# Patient Record
Sex: Female | Born: 1965 | Race: White | Hispanic: No | Marital: Married | Smoking: Never smoker
Health system: Southern US, Community
[De-identification: ages and names within clinical notes are randomized; demographics above are authoritative.]

## PROBLEM LIST (undated history)

## (undated) ENCOUNTER — Ambulatory Visit (HOSPITAL_COMMUNITY): Admission: EM | Payer: BC Managed Care – PPO | Source: Home / Self Care

## (undated) DIAGNOSIS — I10 Essential (primary) hypertension: Secondary | ICD-10-CM

## (undated) DIAGNOSIS — D649 Anemia, unspecified: Secondary | ICD-10-CM

## (undated) DIAGNOSIS — E063 Autoimmune thyroiditis: Secondary | ICD-10-CM

## (undated) DIAGNOSIS — R519 Headache, unspecified: Secondary | ICD-10-CM

## (undated) DIAGNOSIS — F329 Major depressive disorder, single episode, unspecified: Secondary | ICD-10-CM

## (undated) DIAGNOSIS — R0600 Dyspnea, unspecified: Secondary | ICD-10-CM

## (undated) DIAGNOSIS — IMO0002 Reserved for concepts with insufficient information to code with codable children: Secondary | ICD-10-CM

## (undated) DIAGNOSIS — F32A Depression, unspecified: Secondary | ICD-10-CM

## (undated) DIAGNOSIS — K219 Gastro-esophageal reflux disease without esophagitis: Secondary | ICD-10-CM

## (undated) DIAGNOSIS — Z789 Other specified health status: Secondary | ICD-10-CM

## (undated) DIAGNOSIS — D250 Submucous leiomyoma of uterus: Secondary | ICD-10-CM

## (undated) DIAGNOSIS — Z973 Presence of spectacles and contact lenses: Secondary | ICD-10-CM

## (undated) DIAGNOSIS — N950 Postmenopausal bleeding: Secondary | ICD-10-CM

## (undated) HISTORY — DX: Anemia, unspecified: D64.9

## (undated) HISTORY — DX: Major depressive disorder, single episode, unspecified: F32.9

## (undated) HISTORY — PX: OTHER SURGICAL HISTORY: SHX169

## (undated) HISTORY — DX: Depression, unspecified: F32.A

## (undated) HISTORY — PX: TOOTH EXTRACTION: SHX859

## (undated) HISTORY — DX: Essential (primary) hypertension: I10

## (undated) HISTORY — DX: Reserved for concepts with insufficient information to code with codable children: IMO0002

## (undated) HISTORY — PX: TONSILLECTOMY AND ADENOIDECTOMY: SHX28

---

## 2002-09-19 ENCOUNTER — Other Ambulatory Visit: Admission: RE | Admit: 2002-09-19 | Discharge: 2002-09-19 | Payer: Self-pay | Admitting: Obstetrics & Gynecology

## 2003-09-30 ENCOUNTER — Other Ambulatory Visit: Admission: RE | Admit: 2003-09-30 | Discharge: 2003-09-30 | Payer: Self-pay | Admitting: Obstetrics & Gynecology

## 2009-06-15 ENCOUNTER — Encounter: Admission: RE | Admit: 2009-06-15 | Discharge: 2009-06-15 | Payer: Self-pay | Admitting: Family Medicine

## 2011-01-28 ENCOUNTER — Ambulatory Visit
Admission: RE | Admit: 2011-01-28 | Discharge: 2011-01-28 | Disposition: A | Discharge status: Home / Self Care | Payer: BC Managed Care – PPO | Source: Ambulatory Visit | Attending: Dermatology | Admitting: Dermatology

## 2011-01-28 ENCOUNTER — Other Ambulatory Visit: Payer: Self-pay | Admitting: Dermatology

## 2011-01-28 DIAGNOSIS — M25569 Pain in unspecified knee: Secondary | ICD-10-CM

## 2013-05-24 ENCOUNTER — Encounter: Payer: Self-pay | Admitting: Obstetrics and Gynecology

## 2013-05-24 ENCOUNTER — Ambulatory Visit (INDEPENDENT_AMBULATORY_CARE_PROVIDER_SITE_OTHER): Payer: BC Managed Care – PPO | Admitting: Obstetrics and Gynecology

## 2013-05-24 VITALS — BP 110/70 | HR 70 | Ht 64.5 in | Wt 174.5 lb

## 2013-05-24 DIAGNOSIS — R3129 Other microscopic hematuria: Secondary | ICD-10-CM

## 2013-05-24 DIAGNOSIS — Z01419 Encounter for gynecological examination (general) (routine) without abnormal findings: Secondary | ICD-10-CM

## 2013-05-24 DIAGNOSIS — Z0000 Encounter for general adult medical examination without abnormal findings: Secondary | ICD-10-CM

## 2013-05-24 DIAGNOSIS — N951 Menopausal and female climacteric states: Secondary | ICD-10-CM

## 2013-05-24 LAB — POCT URINALYSIS DIPSTICK
Bilirubin, UA: NEGATIVE
Nitrite, UA: NEGATIVE
Protein, UA: NEGATIVE
pH, UA: 5

## 2013-05-24 LAB — THYROID PANEL WITH TSH
Free Thyroxine Index: 3.3 (ref 1.0–3.9)
T4, Total: 9.9 ug/dL (ref 5.0–12.5)
TSH: 5.021 u[IU]/mL — ABNORMAL HIGH (ref 0.350–4.500)

## 2013-05-24 MED ORDER — NYSTATIN-TRIAMCINOLONE 100000-0.1 UNIT/GM-% EX OINT: TOPICAL_OINTMENT | Freq: Two times a day (BID) | CUTANEOUS | Status: DC

## 2013-05-24 NOTE — Patient Instructions (Signed)

## 2013-05-24 NOTE — Progress Notes (Signed)
Patient ID: Jaclyn Murray, female   DOB: 12-01-1965, 47 y.o.   MRN: [id number] GYNECOLOGY VISIT  PCP:  Holley Bouche, MD  Referring provider:   HPI: 47 y.o.   Married  Caucasian  female   G0P0 with Patient's last menstrual period was 01/06/2013.   here for   AEX. Irregular menses for 5 years.  (In past were always regular and heavy.) Never goes more than three months until now. When periods occur, they last 3 - 4 days, has 1 - 2 days of heavy flow. Can be light. No intermenstrual bleeding.  No significant cramping.   Hot flashes and night sweats. Taking ProGest progesterone cream.  Has not tried [place] yet.   Foggy brain.  Mood swings.  Has switched to a ketogenic diet and already feeling better. Takes Melatonin.  Open to hormonal therapy.  Prefers a natural option.  Took low dose OCPs 4 years ago and felt better than she fel in year while she was taking them.  Wants a refill of Mycology ointment.  Uses externally as needed.  Hgb:  PCP Urine:  Trace RBC's (No symptoms today.  No history of hematuria.)  GYNECOLOGIC HISTORY: Patient's last menstrual period was 01/06/2013. Sexually active:  yes Partner preference: female Contraception: vasectomy   Menopausal hormone therapy: n/a DES exposure:   no Blood transfusions:  no  Sexually transmitted diseases: no   GYN Procedures:  no Mammogram:    never             Pap:  09/2011 wnl  History of abnormal pap smear:  20+ years ago had colposcopy but no treatment to cervix.  Paps reverted to normal.   OB History   Grav Para Term Preterm Abortions TAB SAB Ect Mult Living   0                LIFESTYLE: Exercise:      Walking and body weight exercises         Tobacco:      no Alcohol:        1 drink per month Drug use:     no  OTHER HEALTH MAINTENANCE: Tetanus/TDap:  2010 Gardisil:  NA Influenza:   04/2012 Zostavax:  NA  Bone density:  n/a Colonoscopy:  n/a  Cholesterol check:  04/2012 wnl  Family History  Problem  Relation Age of Onset  . Dementia Mother   . Hypertension Mother   . Parkinson's disease Father   . Cancer Maternal Grandmother 71    colon ca  . Stroke Maternal Grandmother   . Thyroid disease Maternal Grandmother   . Cancer Paternal Grandfather 43    colon ca  . Diabetes Sister   . Hypertension Sister   . Thyroid disease Sister   . Diabetes Sister   . Hypertension Sister   . Hypertension Maternal Grandfather     There are no active problems to display for this patient.  Past Medical History  Diagnosis Date  . Anemia   . Depression   . Dyspareunia     "dryness"  . Hypertension     Past Surgical History  Procedure Laterality Date  . Tonsillectomy and adenoidectomy      age 4    ALLERGIES: Ace inhibitors; Ciprofloxacin; and Erythromycin  Current Outpatient Prescriptions  Medication Sig Dispense Refill  . Cholecalciferol (VITAMIN D) 2000 UNITS tablet Take 2,000 Units by mouth 2 (two) times daily.      . Cyanocobalamin (VITAMIN B-12 CR PO) Take  1 tablet by mouth daily.      . fish oil-omega-3 fatty acids 1000 MG capsule Take 2 g by mouth daily.      . hydrochlorothiazide (MICROZIDE) 12.5 MG capsule Take 12.5 mg by mouth daily.      Marland Kitchen losartan (COZAAR) 100 MG tablet Take 100 mg by mouth daily.      . naproxen sodium (ANAPROX) 550 MG tablet Take 500 mg by mouth daily.      . potassium chloride SA (K-DUR,KLOR-CON) 20 MEQ tablet Take 20 mEq by mouth daily.      . S-Adenosylmethionine (SAM-E COMPLETE PO) Take 1 tablet by mouth daily.       No current facility-administered medications for this visit.     ROS:  Pertinent items are noted in HPI.  SOCIAL HISTORY:  Runner, broadcasting/film/video.   PHYSICAL EXAMINATION:    BP 110/70  Pulse 70  Ht 5' 4.5" (1.638 m)  Wt 174 lb 8 oz (79.153 kg)  BMI 29.5 kg/m2  LMP 01/06/2013   Wt Readings from Last 3 Encounters:  05/24/13 174 lb 8 oz (79.153 kg)     Ht Readings from Last 3 Encounters:  05/24/13 5' 4.5" (1.638 m)    General appearance:  alert, cooperative and appears stated age Head: Normocephalic, without obvious abnormality, atraumatic Neck: no adenopathy, supple, symmetrical, trachea midline and thyroid not enlarged, symmetric, no tenderness/mass/nodules Lungs: clear to auscultation bilaterally Breasts: Inspection negative, No nipple retraction or dimpling, No nipple discharge or bleeding, No axillary or supraclavicular adenopathy, Normal to palpation without dominant masses Heart: regular rate and rhythm Abdomen: soft, non-tender; no masses,  no organomegaly Extremities: extremities normal, atraumatic, no cyanosis or edema Skin: Skin color, texture, turgor normal. No rashes or lesions Lymph nodes: Cervical, supraclavicular, and axillary nodes normal. No abnormal inguinal nodes palpated Neurologic: Grossly normal  Pelvic: External genitalia:  no lesions              Urethra:  normal appearing urethra with no masses, tenderness or lesions              Bartholins and Skenes: normal                 Vagina: normal appearing vagina with normal color and discharge, no lesions              Cervix: normal appearance              Pap and high risk HPV testing done: yes.            Bimanual Exam:  Uterus:  uterus is normal size, shape, consistency and nontender                                      Adnexa: normal adnexa in size, nontender and no masses                                      Rectovaginal: Confirms                                      Anus:  normal sphincter tone, no lesions  ASSESSMENT  Normal gynecologic exam. Menopausal symptoms. Skipped cycles. Microscopic hematuria.  PLAN  Mammogram at [facility].  Patient  will call to schedule.  Pap smear and high risk HPV testing Counseled on use and side effects of HRT.  Will prescribe low dose OCPs or Vivelle Dot and prometrium after normal mammogram returns and blood work back.  Patient will call as a reminder to prescribe. Thyroid panel, FSH, estradiol Will  send urine micro as well.  Recheck in 3 months. Return annually or prn   An After Visit Summary was printed and given to the patient.

## 2013-05-24 NOTE — Addendum Note (Signed)
Addended by: Alphonsa Overall on: 05/24/2013 08:44 AM   Modules accepted: Orders

## 2013-05-25 ENCOUNTER — Other Ambulatory Visit: Payer: Self-pay | Admitting: Obstetrics and Gynecology

## 2013-05-25 DIAGNOSIS — R7989 Other specified abnormal findings of blood chemistry: Secondary | ICD-10-CM

## 2013-05-25 LAB — ESTRADIOL: Estradiol: 23.9 pg/mL

## 2013-05-27 ENCOUNTER — Telehealth: Payer: Self-pay

## 2013-05-27 NOTE — Telephone Encounter (Signed)
Message copied by Alphonsa Overall on Mon May 27, 2013 12:36 PM ------      Message from: Conley Simmonds      Created: Sat May 25, 2013  4:59 AM       Please report labs to patient:            Urine showed no further hematuria with microscopic exam.            TSH is elevated and will need a repeat blood test here when she comes in for a 3 month recheck.  The remaining thyroid parameters are normal (thyroid hormone).              FSH and estradiol look like menopause.            After normal mammogram has returned, patient will call for a prescription for Vivelle Dot and prometrium ------

## 2013-05-27 NOTE — Telephone Encounter (Signed)
Patient notified of lab results and she will call back after her mammogram for RX for Vivelle dot and Prometrium.

## 2013-05-27 NOTE — Telephone Encounter (Signed)
LMOVM to call for lab results. 

## 2013-05-27 NOTE — Telephone Encounter (Signed)
Patient is returning Amanda's call

## 2013-06-03 ENCOUNTER — Ambulatory Visit (HOSPITAL_COMMUNITY)
Admission: RE | Admit: 2013-06-03 | Discharge: 2013-06-03 | Disposition: A | Discharge status: Home / Self Care | Payer: BC Managed Care – PPO | Source: Ambulatory Visit | Attending: Obstetrics and Gynecology | Admitting: Obstetrics and Gynecology

## 2013-06-03 DIAGNOSIS — Z01419 Encounter for gynecological examination (general) (routine) without abnormal findings: Secondary | ICD-10-CM

## 2013-06-03 DIAGNOSIS — Z1231 Encounter for screening mammogram for malignant neoplasm of breast: Secondary | ICD-10-CM | POA: Insufficient documentation

## 2013-06-10 ENCOUNTER — Telehealth: Payer: Self-pay | Admitting: Obstetrics and Gynecology

## 2013-06-10 ENCOUNTER — Other Ambulatory Visit: Payer: Self-pay | Admitting: Obstetrics and Gynecology

## 2013-06-10 DIAGNOSIS — R7989 Other specified abnormal findings of blood chemistry: Secondary | ICD-10-CM

## 2013-06-10 MED ORDER — PROGESTERONE MICRONIZED 100 MG PO CAPS: 100.0000 mg | ORAL_CAPSULE | Freq: Every day | ORAL | Status: DC

## 2013-06-10 MED ORDER — ESTRADIOL 0.05 MG/24HR TD PTTW: 1.0000 | MEDICATED_PATCH | TRANSDERMAL | Status: DC

## 2013-06-10 NOTE — Telephone Encounter (Signed)
Please inform the patient I sent an RX to her pharmacy for Vivelle Dot and Prometrium to her pharmacy.  Patient will need a recheck in 3 months for both her hormone medication and for thyroid function retesting. Her TSH was elevated but she had remaining thyroid testing normal.   I will ask her to get Ketosticks from her PCP.  I do not have enough familiarity with them to be able to prescribe them.

## 2013-06-10 NOTE — Telephone Encounter (Signed)
Patient had MMG on 06-03-13, Birads1, result in computer. FSH labs from 05-24-13 showed menopause and result note indicates you planned Vivelle Dot and Prometrium. Patient is ready to begin this. States that in the meantime she has started a "ketogenic diet" which maintains ketosis and is helping with her hot flashes.  WT 170, has lost 7 lbs, not intended for weight loss but menopausal symptoms management. She would like to know if you will prescribe the ketostix for blood ketosis monitoring, like the same stix for blood glucose monitoring, so it will help with insurance coverage. Advised will review with provider and call her back with instructions on Vivelle Dot and Prometrium.  [employer].

## 2013-06-10 NOTE — Telephone Encounter (Signed)
Spoke with pt about BS advice and Rx's sent to pharmacy. Pt already scheduled for 3 month checkup in January. Pt will ask PCP about ketosticks.

## 2013-06-10 NOTE — Telephone Encounter (Signed)
Chief Complaint  Patient presents with   Results  pt says she has gotten her mammogram results back so she could start hrt.

## 2013-06-13 ENCOUNTER — Other Ambulatory Visit: Payer: Self-pay

## 2013-06-20 ENCOUNTER — Telehealth: Payer: Self-pay | Admitting: Obstetrics and Gynecology

## 2013-06-20 NOTE — Telephone Encounter (Signed)
Phone call to patient.  On HRT for 10 days now, Vivelle and Prometrium.  Started with spotting last night.  Now bleeding and cramping.  Feels like she is on her period. LMP prior was June 2014.   Patient will continue HRT. Keep bleeding calendar. If has prolonged bleeding over one week, will call back.  If has monthly cycles return, will switch to low dose OCP.

## 2013-06-20 NOTE — Telephone Encounter (Signed)
Patient started HRT recently and is calling re: "Started spotting last night and bleeding today?" Please advise?

## 2013-06-20 NOTE — Telephone Encounter (Signed)
Dr. Edward Jolly,  Spoke with patient. Started Vivelle and Prometrium 11/3. Last night started with some spotting and today having some increased bleeding. Using pad and changing q 3 hours. Noticed some clotting. LMP 01/06/2013. Has not missed any progesterone tablets that patient is aware of.  What do you advise?

## 2013-07-25 ENCOUNTER — Telehealth: Payer: Self-pay | Admitting: Obstetrics and Gynecology

## 2013-07-25 NOTE — Telephone Encounter (Signed)
Patient says she has been bleeding while in HRT.

## 2013-07-25 NOTE — Telephone Encounter (Signed)
Spoke with patient. States that she has had 3 cycles since starting on vivelle and prometrium. Keeping calendar as Dr. Edward Jolly has suggested. Per Dr. Rica Records note, may switch to a low dose ocp if still continues with bleeding (note from 06/20/13). Patient declines OV for Tuesday (next available with Dr. Edward Jolly) due to going out town for holidays.  Advised would send message to covering provider and Dr. Edward Jolly for disposition.   Dr. Hyacinth Meeker can you review and advise if can wait for Dr. Rica Records return?

## 2013-07-25 NOTE — Telephone Encounter (Signed)
Spoke with patient and message from Dr. Hyacinth Meeker given. Verbalized understanding of instructions and agreeable to plan of care.  Will continue with cycle calendar and call back to update.

## 2013-07-25 NOTE — Telephone Encounter (Signed)
Change prometrium to 200mg  days 1-15 monthly.  Stop Prometrium today.  Start 08/08/13 with new dosage.  Expect monthly cycling for a while.

## 2013-08-30 ENCOUNTER — Ambulatory Visit (INDEPENDENT_AMBULATORY_CARE_PROVIDER_SITE_OTHER): Payer: BC Managed Care – PPO | Admitting: Obstetrics and Gynecology

## 2013-08-30 ENCOUNTER — Encounter: Payer: Self-pay | Admitting: Obstetrics and Gynecology

## 2013-08-30 VITALS — BP 119/81 | HR 71 | Resp 16 | Wt 181.0 lb

## 2013-08-30 DIAGNOSIS — R7989 Other specified abnormal findings of blood chemistry: Secondary | ICD-10-CM

## 2013-08-30 DIAGNOSIS — R946 Abnormal results of thyroid function studies: Secondary | ICD-10-CM

## 2013-08-30 DIAGNOSIS — N951 Menopausal and female climacteric states: Secondary | ICD-10-CM

## 2013-08-30 DIAGNOSIS — N952 Postmenopausal atrophic vaginitis: Secondary | ICD-10-CM

## 2013-08-30 LAB — THYROID PANEL WITH TSH
FREE THYROXINE INDEX: 3.1 (ref 1.0–3.9)
T3 Uptake: 34.1 % (ref 22.5–37.0)
T4, Total: 9.1 ug/dL (ref 5.0–12.5)
TSH: 6.204 u[IU]/mL — ABNORMAL HIGH (ref 0.350–4.500)

## 2013-08-30 MED ORDER — ESTRADIOL 0.05 MG/24HR TD PTTW: 1.0000 | MEDICATED_PATCH | TRANSDERMAL | Status: DC

## 2013-08-30 MED ORDER — ESTROGENS, CONJUGATED 0.625 MG/GM VA CREA: 1.0000 | TOPICAL_CREAM | Freq: Every day | VAGINAL | Status: DC

## 2013-08-30 MED ORDER — PROGESTERONE MICRONIZED 200 MG PO CAPS: ORAL_CAPSULE | ORAL | Status: DC

## 2013-08-30 NOTE — Progress Notes (Signed)
Patient ID: Jaclyn Murray, female   DOB: 24-May-1966, 48 y.o.   MRN: [id number]  Subjective  48 year old female here for a recheck of HRT. Seen for menopausal symptoms in the fall and had labs 05/24/13 showing FHS 77.2, Estradiol 23.9, and TSH 5.021 with T4 9.0. Started daily Prometrium 100 mg and Vivelle 0.05 mg. Patient called in with bleeding and was switched to Prometrium 200 mg days 1 - 15.  No bleeding since taking Prometrium 100 mg days 1 - 15.  Thought she was supposed to take only one capsule. Is not on 200 mg.  Changing Vivelle 0.05 twice a week.Loes not having hot flashes or night sweats other than a couple of times. Fogginess of brain may be a little improved. Mood is much more stable.    Some breast tenderness when had a menstrual cycle. No headaches of fluid retention.  Still with vaginal discomfort with dryness.  Mammogram was normal.  Objective  No exam  Assessment  Menopausal symptoms. On Vivelle 0.05 twice weekly and Prometrium 100 mg d 1 - 15 Atrophic vaginitis. Elevated TSH.  Plan  I have increased the Prometrium to 200 mg d 1 - 15.  Patient did not understand the dosage recommended previously when she called in. Continue Vivelle. Refills on both for 11 months.  See Epic.  Keep bleeding calendar. Premarin cream 1/2 gm pv at hs for 2 weeks and then use twice weekly.  See Epic.  Check TFTs today.  Annual exam in October 2015 and sooner as needed.

## 2013-08-30 NOTE — Patient Instructions (Signed)
Conjugated Estrogens vaginal cream What is this medicine? CONJUGATED ESTROGENS (CON ju gate ed ESS troe jenz) are a mixture of female hormones. This cream can help relieve symptoms associated with menopause.like vaginal dryness and irritation. This medicine may be used for other purposes; ask your health care provider or pharmacist if you have questions. COMMON BRAND NAME(S): Premarin What should I tell my health care provider before I take this medicine? They need to know if you have any of these conditions: -abnormal vaginal bleeding -blood vessel disease or blood clots -breast, cervical, endometrial, or uterine cancer -dementia -diabetes -gallbladder disease -heart disease or recent heart attack -high blood pressure -high cholesterol -high level of calcium in the blood -hysterectomy -kidney disease -liver disease -migraine headaches -protein C deficiency -protein S deficiency -stroke -systemic lupus erythematosus (SLE) -tobacco smoker -an unusual or allergic reaction to estrogens other medicines, foods, dyes, or preservatives -pregnant or trying to get pregnant -breast-feeding How should I use this medicine? This medicine is for use in the vagina only. Do not take by mouth. Follow the directions on the prescription label. Use at bedtime unless otherwise directed by your doctor or health care professional. Use the special applicator supplied with the cream. Wash hands before and after use. Fill the applicator with the cream and remove from the tube. Lie on your back, part and bend your knees. Insert the applicator into the vagina and push the plunger to expel the cream into the vagina. Wash the applicator with warm soapy water and rinse well. Use exactly as directed for the complete length of time prescribed. Do not stop using except on the advice of your doctor or health care professional. Talk to your pediatrician regarding the use of this medicine in children. Special care may be  needed. A patient package insert for the product will be given with each prescription and refill. Read this sheet carefully each time. The sheet may change frequently. Overdosage: If you think you have taken too much of this medicine contact a poison control center or emergency room at once. NOTE: This medicine is only for you. Do not share this medicine with others. What if I miss a dose? If you miss a dose, use it as soon as you can. If it is almost time for your next dose, use only that dose. Do not use double or extra doses. What may interact with this medicine? Do not take this medicine with any of the following medications: -aromatase inhibitors like aminoglutethimide, anastrozole, exemestane, letrozole, testolactone This medicine may also interact with the following medications: -barbiturates used for inducing sleep or treating seizures -carbamazepine -grapefruit juice -medicines for fungal infections like itraconazole and ketoconazole -raloxifene or tamoxifen -rifabutin -rifampin -rifapentine -ritonavir -some antibiotics used to treat infections -St. John's Wort -warfarin This list may not describe all possible interactions. Give your health care provider a list of all the medicines, herbs, non-prescription drugs, or dietary supplements you use. Also tell them if you smoke, drink alcohol, or use illegal drugs. Some items may interact with your medicine. What should I watch for while using this medicine? Visit your health care professional for regular checks on your progress. You will need a regular breast and pelvic exam. You should also discuss the need for regular mammograms with your health care professional, and follow his or her guidelines. This medicine can make your body retain fluid, making your fingers, hands, or ankles swell. Your blood pressure can go up. Contact your doctor or health care professional if you  feel you are retaining fluid. If you have any reason to think  you are pregnant; stop taking this medicine at once and contact your doctor or health care professional. Tobacco smoking increases the risk of getting a blood clot or having a stroke, especially if you are more than 49 years old. You are strongly advised not to smoke. If you wear contact lenses and notice visual changes, or if the lenses begin to feel uncomfortable, consult your eye care specialist. If you are going to have elective surgery, you may need to stop taking this medicine beforehand. Consult your health care professional for advice prior to scheduling the surgery. What side effects may I notice from receiving this medicine? Side effects that you should report to your doctor or health care professional as soon as possible: -allergic reactions like skin rash, itching or hives, swelling of the face, lips, or tongue -breast tissue changes or discharge -changes in vision -chest pain -confusion, trouble speaking or understanding -dark urine -general ill feeling or flu-like symptoms -light-colored stools -nausea, vomiting -pain, swelling, warmth in the leg -right upper belly pain -severe headaches -shortness of breath -sudden numbness or weakness of the face, arm or leg -trouble walking, dizziness, loss of balance or coordination -unusual vaginal bleeding -yellowing of the eyes or skin Side effects that usually do not require medical attention (report to your doctor or health care professional if they continue or are bothersome): -hair loss -increased hunger or thirst -increased urination -symptoms of vaginal infection like itching, irritation or unusual discharge -unusually weak or tired This list may not describe all possible side effects. Call your doctor for medical advice about side effects. You may report side effects to FDA at 1-800-FDA-1088. Where should I keep my medicine? Keep out of the reach of children. Store at room temperature between 15 and 30 degrees C (59 and 86  degrees F). Throw away any unused medicine after the expiration date. NOTE: This sheet is a summary. It may not cover all possible information. If you have questions about this medicine, talk to your doctor, pharmacist, or health care provider.  2014, Elsevier/Gold Standard. (2010-10-27 09:20:36)

## 2013-09-11 ENCOUNTER — Telehealth: Payer: Self-pay | Admitting: Obstetrics and Gynecology

## 2013-09-11 ENCOUNTER — Other Ambulatory Visit: Payer: Self-pay | Admitting: Obstetrics and Gynecology

## 2013-09-11 MED ORDER — LEVOTHYROXINE SODIUM 50 MCG PO TABS: 50.0000 ug | ORAL_TABLET | Freq: Every day | ORAL | Status: DC

## 2013-09-11 NOTE — Telephone Encounter (Signed)
Ok to begin Synthroid 50 mcg daily.  Take on an empty stomach in the am, one hour before eating.  I will send one month to the pharmacy.

## 2013-09-11 NOTE — Telephone Encounter (Signed)
Patient calling to follow up on Dr Elza Rafter review of her TSH.  Dr Sabra Heck initially reviewed report but patient preferred to await Dr Elza Rafter review.  Discussed that Dr Quincy Simmonds has reviewed and agrees with recommendation for Synthroid but is also fine to send info to PCP and she can continue to manage this with PCP.  Patient states since she has on-going monitoring of BP with PCP, she would like to have all of this done in one location.  I will fax results to PCP dr Azalia Bilis at [facility], (patient states he is not on EPIC) and then patient can contact him to follow up on this.

## 2013-09-11 NOTE — Telephone Encounter (Signed)
Patient has some questions about recent lab results. She states she is not feeling well and wants to speak with the nurse. She also said someone called her last week about the results and someone was supposed to get back to her. There is no phone note documenting that.

## 2013-09-11 NOTE — Telephone Encounter (Signed)
Patient called back to say she called PCP and cannot get appointment until  end of month. She is feeling so tired that she would like to begin the Synthroid. She will plan to keep the appt with PCP and do follow-up there.  Advised usually recheck levels in approximately 6 weeks.  Will call in one month with one refill.     Routing to provider for final review. Patient agreeable to disposition.   Ok to close encounter?

## 2013-09-12 NOTE — Telephone Encounter (Signed)
Advised patient of Dr Elza Rafter instruction for Synthroid.  Patient states she does her workouts in the AM and cannot do this on an empty stomach.  Instructed that this is the optimal way to take this but if unable to do this, try to plan a consistent time daily and take on empty stomach, such as 1 hour before lunch or dinner.   Rx waas sent to pharm for 30 days and 1 refill.  Ok to close encounter?

## 2013-09-12 NOTE — Telephone Encounter (Signed)
Thanks for your assistance in this patient's care.

## 2013-09-27 ENCOUNTER — Telehealth: Payer: Self-pay | Admitting: Obstetrics and Gynecology

## 2013-09-27 NOTE — Telephone Encounter (Signed)
Spoke with patient. She is taking Prometrium 200 mg Days 1-15. She feels she is having side effects r/t to the Prometrium. States she is having very dry mouth, increased hunger and increased sleepiness despite taking it at night. She feels that she knows dry mouth is r/t to Prometrium as it stopped after day 16 when she stopped taking it. She is taking Prometrium at night and it is still making her very drowsy.  She also states that Prometrium is over 100/month and doesn't want to refill it if she can change rx before she needs to start again on the first.  Advised would send request to Dr. Quincy Simmonds and return call with her instructions.

## 2013-09-27 NOTE — Telephone Encounter (Signed)
Patient is calling about there hormone medication saying that she isnt tolerating it well.

## 2013-09-29 ENCOUNTER — Other Ambulatory Visit: Payer: Self-pay | Admitting: Obstetrics and Gynecology

## 2013-09-29 MED ORDER — MEDROXYPROGESTERONE ACETATE 2.5 MG PO TABS: 2.5000 mg | ORAL_TABLET | Freq: Every day | ORAL | Status: DC

## 2013-09-29 NOTE — Telephone Encounter (Signed)
I recommend switching to Provera 2.5 mg daily instead of the Prometrium. I will place an order for this and cancel the Prometrium. Patient will be due also for a thyroid recheck in April.

## 2013-09-30 ENCOUNTER — Other Ambulatory Visit: Payer: Self-pay | Admitting: Orthopedic Surgery

## 2013-09-30 NOTE — Telephone Encounter (Signed)
Spoke with pt to inform her she can switch to provera 2.5 mg daily and stop taking prometrium. Advised that Rx was sent to her pharmacy. Pt wonders if she will take this on days 1-15 just as she did the prometrium? Advised I would clarify with BS and call her back. Offered lab appt in April for pt to have thyroid recheck, and pt plans to have PCP manage from now on. Pt has appt with him next week.  Dr. Quincy Simmonds, is pt to take provera daily, or only on days 1-15?

## 2013-09-30 NOTE — Telephone Encounter (Signed)
LMTCB for message from Dr. Quincy Simmonds and need to make appt in April.

## 2013-09-30 NOTE — Telephone Encounter (Signed)
Please have patient take the Provera 2.5 mg daily.

## 2013-10-01 NOTE — Telephone Encounter (Signed)
Spoke with patient and advise will take Provera 2.5 mg Daily. Patient agreeable.

## 2013-11-11 ENCOUNTER — Other Ambulatory Visit: Payer: Self-pay | Admitting: Obstetrics and Gynecology

## 2013-11-11 NOTE — Telephone Encounter (Signed)
Left Message To Call Back Re: calling patient to see if PCP is following up on her TSH/Synthroid medication

## 2013-11-12 NOTE — Telephone Encounter (Signed)
S/w patient the pharmacy sent [place] this rx by mistake her PCP is following up with her TSH/Synthroid the pharmacy sent a request to him instead.

## 2013-11-12 NOTE — Telephone Encounter (Signed)
Patient returning Jasmine's call.  

## 2013-11-25 ENCOUNTER — Other Ambulatory Visit: Payer: Self-pay | Admitting: Obstetrics and Gynecology

## 2014-03-07 ENCOUNTER — Other Ambulatory Visit: Payer: Self-pay | Admitting: Obstetrics and Gynecology

## 2014-03-10 ENCOUNTER — Telehealth: Payer: Self-pay | Admitting: Obstetrics and Gynecology

## 2014-03-10 NOTE — Telephone Encounter (Signed)
Spoke with patient. Advised patient that refill that was sent over from pharmacy was for Prometrium 100mg  daily. Advised it was denied because we had switched her to Provera 2.5 mg daily. Patient states that she is no longer taking the Provera because she "did not like it." Patient took the Provera for one month and then states she switched back to the Prometrium 100mg  daily as this is what she was on originally patient states. "I think my body was just getting used to the hormones." Patient is requesting a refill. Advised patient would send a message over to [place] regarding this request and give patient a call back. Patient agreeable.

## 2014-03-10 NOTE — Telephone Encounter (Signed)
Patient calling to speak with nurse about her RX for "progesterone microzide." She states we denied the refill.  [employer]

## 2014-03-11 ENCOUNTER — Other Ambulatory Visit: Payer: Self-pay | Admitting: Obstetrics and Gynecology

## 2014-03-11 MED ORDER — PROGESTERONE MICRONIZED 100 MG PO CAPS: 100.0000 mg | ORAL_CAPSULE | Freq: Every day | ORAL | Status: DC

## 2014-03-11 NOTE — Telephone Encounter (Signed)
Rx for Prometrium 100 mg sent to patient's pharmacy.

## 2014-03-12 NOTE — Telephone Encounter (Signed)
Spoke with patient. Advised rx sent to pharmacy on file. Patient agreeable and verbalizes understanding.  Routing to provider for final review. Patient agreeable to disposition. Will close encounter

## 2014-05-31 ENCOUNTER — Other Ambulatory Visit: Payer: Self-pay | Admitting: Obstetrics and Gynecology

## 2014-06-02 NOTE — Telephone Encounter (Signed)
Incoming Refill Request from [facility]: Progesterone 100mg   Last AEX:05/24/13 Last Refill:03/11/14 #30 X 2 Next AEX:NS Last MMG:06/05/13 Bi-Rads Neg  Please Advise

## 2014-06-03 ENCOUNTER — Telehealth: Payer: Self-pay | Admitting: Emergency Medicine

## 2014-06-03 NOTE — Telephone Encounter (Signed)
Message copied by Michele Mcalpine on Tue Jun 03, 2014 11:15 AM ------      Message from: Pence, BROOK E      Created: Mon Jun 02, 2014 11:49 AM      Regarding: Please contact to schedule annual exam       Please contact patient to schedule an annual exam.       I just refilled her Prometrium for one month.             Thanks,             [place] ------

## 2014-06-03 NOTE — Telephone Encounter (Signed)
Dr. Quincy Simmonds,  Spoke with patient. She is agreeable to scheduling annual exam, next open annual exam with you is 09/2014. I have scheduled patient for annual on 11/13 but this is not an annual time slot. Okay to keep as is or reschedule to February? Patient will need further refills if changing appointment.  Patient was also reminded to schedule mammogram.

## 2014-06-03 NOTE — Telephone Encounter (Signed)
Denyse Dago, CMA at 06/02/2014 9:47 AM     Status: Signed        Incoming Refill Request from [facility]: Progesterone 100mg   Last AEX:05/24/13  Last Refill:03/11/14 #30 X 2  Next AEX:NS  Last MMG:06/05/13 Bi-Rads Neg  Please Advise

## 2014-06-03 NOTE — Telephone Encounter (Signed)
I will be happy to see the patient in November.

## 2014-06-09 ENCOUNTER — Other Ambulatory Visit: Payer: Self-pay | Admitting: Obstetrics and Gynecology

## 2014-06-11 ENCOUNTER — Telehealth: Payer: Self-pay

## 2014-06-11 NOTE — Telephone Encounter (Signed)
-----   Message from [place], MD sent at 05/31/2014  3:43 PM EDT ----- Regarding: Patient overdue for thyroid recheck and annual exam Please contact patient to have her return for both her annual exam and thyroid function recheck.   She is overdue for both.  Thanks!  Josefa Half ----- Message -----    From: SYSTEM    Sent: 05/30/2014  12:01 AM      To: Brook E Amundson de Berton Lan, MD

## 2014-06-11 NOTE — Telephone Encounter (Signed)
Patient is scheduled for aex on 11/13 at 10:15am with Dr.Silva.  Routing to provider for final review. Patient agreeable to disposition. Will close encounter

## 2014-06-20 ENCOUNTER — Ambulatory Visit (INDEPENDENT_AMBULATORY_CARE_PROVIDER_SITE_OTHER): Payer: BC Managed Care – PPO | Admitting: Obstetrics and Gynecology

## 2014-06-20 ENCOUNTER — Encounter: Payer: Self-pay | Admitting: Obstetrics and Gynecology

## 2014-06-20 VITALS — BP 122/88 | HR 86 | Resp 16 | Ht 64.0 in | Wt 187.0 lb

## 2014-06-20 DIAGNOSIS — Z01419 Encounter for gynecological examination (general) (routine) without abnormal findings: Secondary | ICD-10-CM

## 2014-06-20 MED ORDER — ESTRADIOL 0.05 MG/24HR TD PTTW: 1.0000 | MEDICATED_PATCH | TRANSDERMAL | Status: DC

## 2014-06-20 MED ORDER — PROGESTERONE MICRONIZED 100 MG PO CAPS: ORAL_CAPSULE | ORAL | Status: DC

## 2014-06-20 NOTE — Progress Notes (Signed)
48 y.o. G0P0 Married Caucasian female here for annual exam.    Patient is on Vivelle Dot 0.05 mg twice a week and Prometrium 100 mg daily.  Occasional hot flashes.  Sleeping well.  Some vaginal dryness and stopped vaginal estrogen cream due to cost.   Taking Synthroid every morning.  PCP will follow.    Some shortness of breath.  Saw provider and told it was anxiety.  Patient disagrees.  Seeing PCP.   Injury with plantar fasciitis.  Unable to exercise and gained 20 pounds.   Patient's last menstrual period was 11/06/2013.   - This is when patient was doing HRT changing in Prometrium dosing, on Provera, and then back in Prometrium.  Sexually active: Yes.    The current method of family planning is post menopausal status.    Exercising: No.  The patient does not participate in regular exercise at present. Smoker:  no  Health Maintenance: Pap:  05/24/13, negative with neg HR HPV History of abnormal Pap:  Yes, 15 years ago ; had colpo bx MMG:  06/03/13 Colonoscopy:  Never had one BMD:   Never had one TDaP:  2010 Screening Labs: No, Seeing Willey Blade for labs next week.   reports that she has never smoked. She has never used smokeless tobacco. She reports that she drinks about 0.5 oz of alcohol per week. She reports that she does not use illicit drugs.  Past Medical History  Diagnosis Date  . Anemia   . Depression   . Dyspareunia     "dryness"  . Hypertension   . Sinusitis     Past Surgical History  Procedure Laterality Date  . Tonsillectomy and adenoidectomy      age 37    Current Outpatient Prescriptions  Medication Sig Dispense Refill  . chlorpheniramine-HYDROcodone (TUSSIONEX) 10-8 MG/5ML LQCR     . Cyanocobalamin (VITAMIN B-12 CR PO) Take 1 tablet by mouth daily.    Marland Kitchen estradiol (VIVELLE-DOT) 0.05 MG/24HR patch Place 1 patch (0.05 mg total) onto the skin 2 (two) times a week. 8 patch 10  . Cholecalciferol (VITAMIN D) 2000 UNITS tablet Take 2,000 Units by mouth  2 (two) times daily.    . fish oil-omega-3 fatty acids 1000 MG capsule Take 2 g by mouth daily.    . hydrochlorothiazide (MICROZIDE) 12.5 MG capsule Take 12.5 mg by mouth daily.    Marland Kitchen levothyroxine (SYNTHROID, LEVOTHROID) 50 MCG tablet Take 1 tablet (50 mcg total) by mouth daily. 30 tablet 1  . losartan (COZAAR) 100 MG tablet Take 100 mg by mouth daily.    . naproxen sodium (ANAPROX) 550 MG tablet Take 500 mg by mouth as needed.     . nystatin-triamcinolone ointment (MYCOLOG) Apply topically 2 (two) times daily. Apply BID for up to 7 days as needed for external itching. 60 g 0  . potassium chloride SA (K-DUR,KLOR-CON) 20 MEQ tablet Take 20 mEq by mouth daily.    . progesterone (PROMETRIUM) 100 MG capsule TAKE (1) CAPSULE DAILY. 30 capsule 0  . S-Adenosylmethionine (SAM-E COMPLETE PO) Take 1 tablet by mouth daily.     No current facility-administered medications for this visit.    Family History  Problem Relation Age of Onset  . Dementia Mother   . Hypertension Mother   . Parkinson's disease Father   . Cancer Maternal Grandmother 9    colon ca  . Stroke Maternal Grandmother   . Thyroid disease Maternal Grandmother   . Cancer Paternal Grandfather 1  colon ca  . Diabetes Sister   . Hypertension Sister   . Thyroid disease Sister   . Diabetes Sister   . Hypertension Sister   . Hypertension Maternal Grandfather     ROS:  Pertinent items are noted in HPI.  Otherwise, a comprehensive ROS was negative.  Exam:   BP 122/88 mmHg  Pulse 86  Resp 16  Ht 5\' 4"  (1.626 m)  Wt 187 lb (84.823 kg)  BMI 32.08 kg/m2  LMP 11/06/2013     Height: 5\' 4"  (162.6 cm)  Ht Readings from Last 3 Encounters:  06/20/14 5\' 4"  (1.626 m)  05/24/13 5' 4.5" (1.638 m)    General appearance: alert, cooperative and appears stated age Head: Normocephalic, without obvious abnormality, atraumatic Neck: no adenopathy, supple, symmetrical, trachea midline and thyroid normal to inspection and palpation Lungs:  clear to auscultation bilaterally Breasts: normal appearance, no masses or tenderness, Inspection negative, No nipple retraction or dimpling, No nipple discharge or bleeding, No axillary or supraclavicular adenopathy Heart: regular rate and rhythm Abdomen: soft, non-tender; bowel sounds normal; no masses,  no organomegaly Extremities: extremities normal, atraumatic, no cyanosis or edema Skin: Skin color, texture, turgor normal. No rashes or lesions Lymph nodes: Cervical, supraclavicular, and axillary nodes normal. No abnormal inguinal nodes palpated Neurologic: Grossly normal   Pelvic: External genitalia:  no lesions              Urethra:  normal appearing urethra with no masses, tenderness or lesions              Bartholins and Skenes: normal                 Vagina: normal appearing vagina with normal color and discharge, no lesions              Cervix: no lesions              Pap taken: No. Bimanual Exam:  Uterus:  normal size, contour, position, consistency, mobility, non-tender              Adnexa: normal adnexa and no mass, fullness, tenderness               Rectovaginal: Confirms               Anus:  normal sphincter tone, no lesions  A:  Well Woman with normal exam Remote history of abnormal pap.  HRT patient.  Atrophic vaginal changes.  Hypothyroidism.  Seeing PCP.   P:   Mammogram recommended yearly.  Patient will schedule her appointment.  Understands the importance of yearly mammogram, especially while on HRT.  pap smear not indicated.  Discussed HRT - risks and benefits.  Will continue with Vivelle Dot 0.05 mg twice weekly and Prometrium daily.  Rx 3 months with 3 refills.  I did mention to the patient the option for FemRing and Prometrium as an alternative.  She will research the cost of the [place]. Labs and refill on Synthroid with her PCP.  return annually or prn  An After Visit Summary was printed and given to the patient.

## 2014-06-20 NOTE — Patient Instructions (Signed)
EXERCISE AND DIET:  We recommended that you start or continue a regular exercise program for good health. Regular exercise means any activity that makes your heart beat faster and makes you sweat.  We recommend exercising at least 30 minutes per day at least 3 days a week, preferably 4 or 5.  We also recommend a diet low in fat and sugar.  Inactivity, poor dietary choices and obesity can cause diabetes, heart attack, stroke, and kidney damage, among others.    ALCOHOL AND SMOKING:  Women should limit their alcohol intake to no more than 7 drinks/beers/glasses of wine (combined, not each!) per week. Moderation of alcohol intake to this level decreases your risk of breast cancer and liver damage. And of course, no recreational drugs are part of a healthy lifestyle.  And absolutely no smoking or even second hand smoke. Most people know smoking can cause heart and lung diseases, but did you know it also contributes to weakening of your bones? Aging of your skin?  Yellowing of your teeth and nails?  CALCIUM AND VITAMIN D:  Adequate intake of calcium and Vitamin D are recommended.  The recommendations for exact amounts of these supplements seem to change often, but generally speaking 600 mg of calcium (either carbonate or citrate) and 800 units of Vitamin D per day seems prudent. Certain women may benefit from higher intake of Vitamin D.  If you are among these women, your doctor will have told you during your visit.    PAP SMEARS:  Pap smears, to check for cervical cancer or precancers,  have traditionally been done yearly, although recent scientific advances have shown that most women can have pap smears less often.  However, every woman still should have a physical exam from her gynecologist every year. It will include a breast check, inspection of the vulva and vagina to check for abnormal growths or skin changes, a visual exam of the cervix, and then an exam to evaluate the size and shape of the uterus and  ovaries.  And after 49 years of age, a rectal exam is indicated to check for rectal cancers. We will also provide age appropriate advice regarding health maintenance, like when you should have certain vaccines, screening for sexually transmitted diseases, bone density testing, colonoscopy, mammograms, etc.   MAMMOGRAMS:  All women over 40 years old should have a yearly mammogram. Many facilities now offer a "3D" mammogram, which may cost around $50 extra out of pocket. If possible,  we recommend you accept the option to have the 3D mammogram performed.  It both reduces the number of women who will be called back for extra views which then turn out to be normal, and it is better than the routine mammogram at detecting truly abnormal areas.    COLONOSCOPY:  Colonoscopy to screen for colon cancer is recommended for all women at age 50.  We know, you hate the idea of the prep.  We agree, BUT, having colon cancer and not knowing it is worse!!  Colon cancer so often starts as a polyp that can be seen and removed at colonscopy, which can quite literally save your life!  And if your first colonoscopy is normal and you have no family history of colon cancer, most women don't have to have it again for 10 years.  Once every ten years, you can do something that may end up saving your life, right?  We will be happy to help you get it scheduled when you are ready.    Be sure to check your insurance coverage so you understand how much it will cost.  It may be covered as a preventative service at no cost, but you should check your particular policy.     Estradiol vaginal ring (Femring) What is this medicine? ESTRADIOL (es tra DYE ole) vaginal ring is an insert that contains a female hormone. This medicine helps relieve symptoms of vaginal irritation and dryness that occurs in some women during menopause. This medicine can also help relieve hot flashes. This medicine may be used for other purposes; ask your health care  provider or pharmacist if you have questions. COMMON BRAND NAME(S): Femring What should I tell my health care provider before I take this medicine? They need to know if you have any of these conditions: -abnormal vaginal bleeding -blood vessel disease or blood clots -breast, cervical, endometrial, ovarian, liver, or uterine cancer -dementia -diabetes -gallbladder disease -heart disease or recent heart attack -high blood pressure -high cholesterol -high level of calcium in the blood -hysterectomy -kidney disease -liver disease -migraine headaches -protein C deficiency -protein S deficiency -stroke -systemic lupus erythematosus (SLE) -tobacco smoker -an unusual or allergic reaction to estrogens, other hormones, medicines, foods, dyes, or preservatives -pregnant or trying to get pregnant -breast-feeding How should I use this medicine? This medicine may be inserted by you or your physician. Follow the directions that are included with your prescription. If you are unsure how to insert the ring, contact your doctor or health care professional. The vaginal ring should remain in place for 90 days. After 90 days you should replace your old ring and insert a new one. Do not stop using except on the advice of your doctor or health care professional. Contact your pediatrician regarding the use of this medicine in children. Special care may be needed. A patient package insert for the product will be given with each prescription and refill. Read this sheet carefully each time. The sheet may change frequently. Overdosage: If you think you have taken too much of this medicine contact a poison control center or emergency room at once. NOTE: This medicine is only for you. Do not share this medicine with others. What if I miss a dose? If you miss a dose, use it as soon as you can. If it is almost time for your next dose, use only that dose. Do not use double or extra doses. What may interact with  this medicine? Do not take this medicine with any of the following medications: -aromatase inhibitors like aminoglutethimide, anastrozole, exemestane, letrozole, testolactone, vorozole This medicine may also interact with the following medications: -carbamazepine -certain antibiotics used to treat infections -certain barbiturates used for inducing sleep or treating seizures -grapefruit juice -medicines for fungus infections like itraconazole and ketoconazole -raloxifene or tamoxifen -rifabutin, rifampin, or rifapentine -ritonavir -St. John's Wort This list may not describe all possible interactions. Give your health care provider a list of all the medicines, herbs, non-prescription drugs, or dietary supplements you use. Also tell them if you smoke, drink alcohol, or use illegal drugs. Some items may interact with your medicine. What should I watch for while using this medicine? Visit your doctor or health care professional for regular checks on your progress. You will need a regular breast and pelvic exam and Pap smear while on this medicine. You should also discuss the need for regular mammograms with your health care professional, and follow his or her guidelines for these tests. This medicine can make your body retain fluid, making your  fingers, hands, or ankles swell. Your blood pressure can go up. Contact your doctor or health care professional if you feel you are retaining fluid. If you have any reason to think you are pregnant, stop taking this medicine right away and contact your doctor or health care professional. Smoking increases the risk of getting a blood clot or having a stroke while you are taking this medicine, especially if you are more than 49 years old. You are strongly advised not to smoke. If you wear contact lenses and notice visual changes, or if the lenses begin to feel uncomfortable, consult your eye doctor or health care professional. This medicine can increase the risk  of developing a condition (endometrial hyperplasia) that may lead to cancer of the lining of the uterus. Taking progestins, another hormone drug, with this medicine lowers the risk of developing this condition. Therefore, if your uterus has not been removed (by a hysterectomy), your doctor may prescribe a progestin for you to take together with your estrogen. You should know, however, that taking estrogens with progestins may have additional health risks. You should discuss the use of estrogens and progestins with your health care professional to determine the benefits and risks for you. If you are going to have surgery, you may need to stop taking this medicine. Consult your health care professional for advice before you schedule the surgery. You may bathe or participate in other activities while using this medicine. You do not need to remove the vaginal ring during sexual or other activities unless you are more comfortable doing so. Within the 90-day dosage period, you may remove the vaginal ring, rinse it with clean lukewarm (not hot or boiling) water, and re-insert the ring as needed. What side effects may I notice from receiving this medicine? Side effects that you should report to your doctor or health care professional as soon as possible: -allergic reactions like skin rash, itching or hives, swelling of the face, lips, or tongue -breast tissue changes or discharge -changes in vision -chest pain -confusion, trouble speaking or understanding -dark urine -general ill feeling or flu-like symptoms -light-colored stools -nausea, vomiting -pain, swelling, warmth in the leg -right upper belly pain -severe headaches -shortness of breath -sudden numbness or weakness of the face, arm or leg -trouble walking, dizziness, loss of balance or coordination -unusual vaginal bleeding -yellowing of the eyes or skin Side effects that usually do not require medical attention (report to your doctor or health  care professional if they continue or are bothersome): -hair loss -increased hunger or thirst -increased urination -symptoms of vaginal infection like itching, irritation or unusual discharge -unusually weak or tired This list may not describe all possible side effects. Call your doctor for medical advice about side effects. You may report side effects to FDA at 1-800-FDA-1088. Where should I keep my medicine? Keep out of the reach of children. Store at room temperature between 15 and 30 degrees C (59 and 86 degrees F). Throw away any unused medicine after the expiration date. NOTE: This sheet is a summary. It may not cover all possible information. If you have questions about this medicine, talk to your doctor, pharmacist, or health care provider.  2015, [employer]. (2010-10-27 09:10:06)

## 2014-08-25 ENCOUNTER — Other Ambulatory Visit: Payer: Self-pay | Admitting: Obstetrics and Gynecology

## 2014-08-25 ENCOUNTER — Telehealth: Payer: Self-pay | Admitting: Obstetrics and Gynecology

## 2014-08-25 DIAGNOSIS — Z1231 Encounter for screening mammogram for malignant neoplasm of breast: Secondary | ICD-10-CM

## 2014-08-25 NOTE — Telephone Encounter (Signed)
Spoke with patient. Patient is currently taking Prometrium 100 mg daily and using Vivelle dot 0.05mg  twice weekly. Patient had light cycle in December. Started cycle again over this past weekend. "It is really heavy. This is the heaviest period I have ever had." Patient is changing pad every 4-5 hours. "I am soaking through them. I have been moody and have cramping just like with my periods." Denies any fatigue, light headedness, or weakness. Advised patient will need to be seen for evaluation with Dr.Silva. Patient is agreeable. Appointment scheduled for 1/20 at 1:30pm with Dr.Silva. Patient is agreeable to date and time. Advised if bleeding increases to having to change pad every hour due to soaking through or develops any new symptoms will need to be seen sooner. Patient is agreeable.  Routing to provider for final review. Patient agreeable to disposition. Will close encounter

## 2014-08-25 NOTE — Telephone Encounter (Signed)
Patient is on hrt medication and would like an appointment with Dr Quincy Simmonds for follow-up.

## 2014-08-27 ENCOUNTER — Encounter: Payer: Self-pay | Admitting: Obstetrics and Gynecology

## 2014-08-27 ENCOUNTER — Ambulatory Visit (INDEPENDENT_AMBULATORY_CARE_PROVIDER_SITE_OTHER): Payer: BLUE CROSS/BLUE SHIELD | Admitting: Obstetrics and Gynecology

## 2014-08-27 VITALS — BP 122/80 | HR 68 | Ht 64.0 in | Wt 191.6 lb

## 2014-08-27 DIAGNOSIS — N950 Postmenopausal bleeding: Secondary | ICD-10-CM

## 2014-08-27 LAB — CBC
HEMATOCRIT: 32.5 % — AB (ref 36.0–46.0)
Hemoglobin: 11.4 g/dL — ABNORMAL LOW (ref 12.0–15.0)
MCH: 30.2 pg (ref 26.0–34.0)
MCHC: 35.1 g/dL (ref 30.0–36.0)
MCV: 86 fL (ref 78.0–100.0)
MPV: 8.6 fL (ref 8.6–12.4)
Platelets: 322 10*3/uL (ref 150–400)
RBC: 3.78 MIL/uL — ABNORMAL LOW (ref 3.87–5.11)
RDW: 13.8 % (ref 11.5–15.5)
WBC: 6.4 10*3/uL (ref 4.0–10.5)

## 2014-08-27 NOTE — Progress Notes (Signed)
Patient ID: Jaclyn Murray, female   DOB: 1966-01-09, 49 y.o.   MRN: [id number] GYNECOLOGY VISIT  PCP:  Willey Blade, MD  Referring provider:   HPI: 49 y.o.   Married  Caucasian  female   G0P0 with Patient's last menstrual period was 08/23/2014 (exact date).   here for abnormal uterine bleeding while on HRT.   Patient is on Prometrium 100 mg daily and Vivelle Dot 0.05 mg twice weekly.    Patient's last menstrual period was 11/06/2013. - This is when patient was doing HRT changing in Prometrium dosing, on Provera, and then back in Prometrium.    Bled again in July 30, 2014 light bleeding for 4 - 5 days like a light menses. Then had heavy bleeding and cramping starting 08/23/14 and has now stopped.  Felt very emotional.  No missed Prometrium or patch coming off.   Now off thyroid medication by her PCP.  Switched from Levothyroxine to another thyroid replacement and patient did not tolerate it. Stopped one month ago.  Was also on Phentermine for a brief time as well and was stopped.  May start Armour thyroid in future.  Will do follow up with PCP in one month.   GYNECOLOGIC HISTORY: Patient's last menstrual period was 08/23/2014 (exact date). Sexually active:  yes Partner preference: female Contraception:   postmenopausal Menopausal hormone therapy: Vivelle Dot and Prometrium 100 mg DES exposure:  no Blood transfusions:  As an infant Sexually transmitted diseases:    GYN procedures and prior surgeries: no  Last mammogram:   06-03-13 wnl--fatty tissue:[facility]            Last pap and high risk HPV testing: 05-24-13 wnl:neg HR HPV   History of abnormal pap smear:  Yes, 16 years ago: hx colposcopy--wnl--no treatment to cervix   OB History    Gravida Para Term Preterm AB TAB SAB Ectopic Multiple Living   0                LIFESTYLE: Exercise:               Tobacco:  no Alcohol:    1 drink per month Drug use:  no  There are no active problems to display  for this patient.   Past Medical History  Diagnosis Date  . Anemia   . Depression   . Dyspareunia     "dryness"  . Hypertension   . Sinusitis     Past Surgical History  Procedure Laterality Date  . Tonsillectomy and adenoidectomy      age 46    Current Outpatient Prescriptions  Medication Sig Dispense Refill  . chlorpheniramine-HYDROcodone (TUSSIONEX) 10-8 MG/5ML LQCR     . Cholecalciferol (VITAMIN D) 2000 UNITS tablet Take 2,000 Units by mouth 2 (two) times daily.    Marland Kitchen estradiol (VIVELLE-DOT) 0.05 MG/24HR patch Place 1 patch (0.05 mg total) onto the skin 2 (two) times a week. 24 patch 3  . hydrochlorothiazide (MICROZIDE) 12.5 MG capsule Take 12.5 mg by mouth daily.    Marland Kitchen losartan (COZAAR) 100 MG tablet Take 100 mg by mouth daily.    . Melatonin 3 MG CAPS Take by mouth.    . naproxen sodium (ANAPROX) 550 MG tablet Take 500 mg by mouth as needed.     . nystatin-triamcinolone ointment (MYCOLOG) Apply topically 2 (two) times daily. Apply BID for up to 7 days as needed for external itching. 60 g 0  . potassium chloride SA (K-DUR,KLOR-CON) 20 MEQ tablet  Take 20 mEq by mouth daily.    . progesterone (PROMETRIUM) 100 MG capsule TAKE (1) CAPSULE DAILY. 90 capsule 3  . hydrocortisone (CORTEF) 5 MG tablet   0  . S-Adenosylmethionine (SAM-E COMPLETE PO) Take 1 tablet by mouth daily.     No current facility-administered medications for this visit.     ALLERGIES: Ace inhibitors; Ciprofloxacin; and Erythromycin  Family History  Problem Relation Age of Onset  . Dementia Mother   . Hypertension Mother   . Parkinson's disease Father   . Cancer Maternal Grandmother 6    colon ca  . Stroke Maternal Grandmother   . Thyroid disease Maternal Grandmother   . Cancer Paternal Grandfather 20    colon ca  . Diabetes Sister   . Hypertension Sister   . Thyroid disease Sister   . Diabetes Sister   . Hypertension Sister   . Hypertension Maternal Grandfather     History   Social History   . Marital Status: Married    Spouse Name: N/A    Number of Children: N/A  . Years of Education: N/A   Occupational History  . Not on file.   Social History Main Topics  . Smoking status: Never Smoker   . Smokeless tobacco: Never Used  . Alcohol Use: 0.6 oz/week    1 Not specified per week     Comment: 1 drink per month  . Drug Use: No  . Sexual Activity:    Partners: Male    Birth Control/ Protection: Other-see comments     Comment: vasectomy   Other Topics Concern  . Not on file   Social History Narrative    ROS:  Pertinent items are noted in HPI.  PHYSICAL EXAMINATION:    BP 122/80 mmHg  Pulse 68  Ht 5\' 4"  (1.626 m)  Wt 191 lb 9.6 oz (86.909 kg)  BMI 32.87 kg/m2  LMP 08/23/2014 (Exact Date)   Wt Readings from Last 3 Encounters:  08/27/14 191 lb 9.6 oz (86.909 kg)  06/20/14 187 lb (84.823 kg)  08/30/13 181 lb (82.101 kg)     Ht Readings from Last 3 Encounters:  08/27/14 5\' 4"  (1.626 m)  06/20/14 5\' 4"  (1.626 m)  05/24/13 5' 4.5" (1.638 m)    General appearance: alert, cooperative and appears stated age   Pelvic: External genitalia:  no lesions              Urethra:  normal appearing urethra with no masses, tenderness or lesions              Bartholins and Skenes: normal                 Vagina: normal appearing vagina with normal color and discharge, no lesions, menstrual blood noted.               Cervix: normal appearance                 Bimanual Exam:  Uterus:  uterus is normal size, shape, consistency and nontender - exam limited by [facility] and full bladder.                                       Adnexa: normal adnexa in size, nontender and no masses  ASSESSMENT  Postmenopausal bleeding versus menstrual cycle.  Patient on HRT. Hypothyroidism.  Off thyroid replacement.   PLAN  Check FSH, estradiol, and CBC.  If not menopausal, would recommend stopping HRT and considering progesterone only OCP or trial of observation  only.  If menopausal, would recommend pelvic ultrasound, possible sonohysterogram, and EMB.  Procedures reviewed with patient.  Thyroid care per PCP.   An After Visit Summary was printed and given to the patient.  15 minutes face to face time of which over 50% was spent in counseling.

## 2014-08-27 NOTE — Patient Instructions (Signed)
If your hormonal testing indicates menopause, I will have you return for an office visit for a pelvic ultrasound, possible saline ultrasound, and endometrial biopsy.   If you hormonal testing indicates you are not in menopause, we will have you come off your hormone therapy and consider a birth control pill that is estrogen free.

## 2014-08-28 LAB — ESTRADIOL: ESTRADIOL: 58.3 pg/mL

## 2014-08-28 LAB — FOLLICLE STIMULATING HORMONE: FSH: 12.2 m[IU]/mL

## 2014-08-29 ENCOUNTER — Ambulatory Visit (HOSPITAL_COMMUNITY): Payer: Self-pay

## 2014-09-09 ENCOUNTER — Ambulatory Visit (HOSPITAL_COMMUNITY)
Admission: RE | Admit: 2014-09-09 | Discharge: 2014-09-09 | Disposition: A | Discharge status: Home / Self Care | Payer: BLUE CROSS/BLUE SHIELD | Source: Ambulatory Visit | Attending: Obstetrics and Gynecology | Admitting: Obstetrics and Gynecology

## 2014-09-09 DIAGNOSIS — Z1231 Encounter for screening mammogram for malignant neoplasm of breast: Secondary | ICD-10-CM | POA: Diagnosis present

## 2015-07-03 ENCOUNTER — Other Ambulatory Visit: Payer: Self-pay | Admitting: Obstetrics and Gynecology

## 2015-07-06 NOTE — Telephone Encounter (Signed)
Medication refill request: Progesterone  Last AEX:  06/20/14 Dr. Quincy Simmonds Next AEX: ? Last MMG (if hormonal medication request): 09/09/14 BIRADS1:neg Refill authorized: 06/20/14 #[id number]. Today please advise.   Left voicemail to call back to schedule AEX.

## 2016-04-20 ENCOUNTER — Other Ambulatory Visit: Payer: Self-pay | Admitting: Internal Medicine

## 2016-04-20 DIAGNOSIS — Z1231 Encounter for screening mammogram for malignant neoplasm of breast: Secondary | ICD-10-CM

## 2016-05-04 ENCOUNTER — Ambulatory Visit
Admission: RE | Admit: 2016-05-04 | Discharge: 2016-05-04 | Disposition: A | Discharge status: Home / Self Care | Payer: BLUE CROSS/BLUE SHIELD | Source: Ambulatory Visit | Attending: Internal Medicine | Admitting: Internal Medicine

## 2016-05-04 DIAGNOSIS — Z1231 Encounter for screening mammogram for malignant neoplasm of breast: Secondary | ICD-10-CM

## 2016-05-08 HISTORY — PX: COLONOSCOPY WITH PROPOFOL: SHX5780

## 2016-12-01 ENCOUNTER — Telehealth: Payer: Self-pay | Admitting: Obstetrics and Gynecology

## 2016-12-01 NOTE — Telephone Encounter (Signed)
Patient is bleeding on HRT. Patient would like to see Dr.Silva.

## 2016-12-01 NOTE — Telephone Encounter (Signed)
Spoke with patient. Patient states has been on HRT for years, currently managed by pcp. Patient states she has been bleeding for last 3-4 weeks, mostly spotting, sometimes heavier. Patient reports feels like a cycle with menstrual cramps. Patient states pcp recommended f/u with GYN. Patient reports taking 75mg  progesterone daily and estradiol patch 0.375mg  twice a week. Last OV 08/27/14. Patient request OV, declined appointment for 4/26, scheduled for 4/27 at 11:30am with Dr. Quincy Simmonds. Patient verbalizes understanding and is agreeable.   Routing to provider for final review. Patient is agreeable to disposition. Will close encounter.

## 2016-12-02 ENCOUNTER — Ambulatory Visit (INDEPENDENT_AMBULATORY_CARE_PROVIDER_SITE_OTHER): Payer: BLUE CROSS/BLUE SHIELD | Admitting: Obstetrics and Gynecology

## 2016-12-02 ENCOUNTER — Encounter: Payer: Self-pay | Admitting: Obstetrics and Gynecology

## 2016-12-02 VITALS — BP 128/80 | HR 76 | Resp 16 | Wt 190.0 lb

## 2016-12-02 DIAGNOSIS — N939 Abnormal uterine and vaginal bleeding, unspecified: Secondary | ICD-10-CM | POA: Diagnosis not present

## 2016-12-02 DIAGNOSIS — Z79890 Hormone replacement therapy (postmenopausal): Secondary | ICD-10-CM | POA: Diagnosis not present

## 2016-12-02 NOTE — Progress Notes (Signed)
GYNECOLOGY  VISIT   HPI: 51 y.o.   Married  Caucasian  female   G0P0 with No LMP recorded (lmp unknown). Patient is postmenopausal.   here for post menopausal bleeding on HRT.  Spotting last 3-4 weeks, continuously.  Feels like cycle with menstrual cramps.   On Progesterone 75 mg daily and on Minivelle 0.0375 mg twice weekly.  Seen 08/27/14 for abnormal bleeding on 08/23/14 while on HRT.  Her last prior menses at that time were 11/06/13 and 07/30/14. FSH 12.2 and E2 58.3 on 08/27/14. As she was not in menopause, no further work up done.  Stopped all hormones after this.  Restarted hormones a few months later due to hot flashes.  One year later had break through bleeding for one week.  Both her hormones were reduced at that time.  No further bleeding until now.   Has been using compounded estrogen cream as well.   Having difficulty controlling Hashimotos thyroiditis through diet.  Last TFTs done in November.  TSH was 4 with last visit.   PCP - Heath Gold  GYNECOLOGIC HISTORY: No LMP recorded (lmp unknown). Patient is postmenopausal. Contraception:   postmenopausal Menopausal hormone therapy: Vivelle Dot and Prometrium 100 mg Last mammogram: 05/04/16 [facility] Last pap smear:   05/24/13 Neg: Neg HR HPV        OB History    Gravida Para Term Preterm AB Living   0             SAB TAB Ectopic Multiple Live Births                     There are no active problems to display for this patient.   Past Medical History:  Diagnosis Date  . Anemia   . Depression   . Dyspareunia    "dryness"  . Hypertension   . Sinusitis     Past Surgical History:  Procedure Laterality Date  . TONSILLECTOMY AND ADENOIDECTOMY     age 50    Current Outpatient Prescriptions  Medication Sig Dispense Refill  . B Complex-C-E-Zn (B COMPLEX-C-E-ZINC) tablet Take 1 tablet by mouth daily.    . Cholecalciferol (VITAMIN D) 2000 UNITS tablet Take 2,000 Units by mouth 2 (two)  times daily.    Marland Kitchen estradiol (VIVELLE-DOT) 0.0375 MG/24HR Place 1 patch onto the skin 2 (two) times a week.  4  . losartan (COZAAR) 100 MG tablet Take 50 mg by mouth daily.     . Multiple Vitamins-Minerals (MULTIVITAMIN PO) Take by mouth.    . naproxen sodium (ANAPROX) 550 MG tablet Take 500 mg by mouth as needed.     . progesterone (PROMETRIUM) 100 MG capsule TAKE (1) CAPSULE DAILY. (Patient taking differently: Take 75 mg by mouth daily. TAKE (1) CAPSULE DAILY.) 90 capsule 3  . S-Adenosylmethionine (SAM-E COMPLETE PO) Take 1 tablet by mouth daily.    . TURMERIC PO Take by mouth daily.     No current facility-administered medications for this visit.      ALLERGIES: Ace inhibitors; Ciprofloxacin; and Erythromycin  Family History  Problem Relation Age of Onset  . Dementia Mother   . Hypertension Mother   . Parkinson's disease Father   . Diabetes Sister   . Hypertension Sister   . Thyroid disease Sister   . Diabetes Sister   . Hypertension Sister   . Cancer Maternal Grandmother 46    colon ca  . Stroke Maternal Grandmother   . Thyroid  disease Maternal Grandmother   . Cancer Paternal Grandfather 68    colon ca  . Hypertension Maternal Grandfather     Social History   Social History  . Marital status: Married    Spouse name: N/A  . Number of children: N/A  . Years of education: N/A   Occupational History  . Not on file.   Social History Main Topics  . Smoking status: Never Smoker  . Smokeless tobacco: Never Used  . Alcohol use 0.6 oz/week    1 Standard drinks or equivalent per week     Comment: 1 drink per month  . Drug use: No  . Sexual activity: Yes    Partners: Male    Birth control/ protection: Other-see comments     Comment: vasectomy   Other Topics Concern  . Not on file   Social History Narrative  . No narrative on file    ROS:  Pertinent items are noted in HPI.  PHYSICAL EXAMINATION:    BP 128/80 (BP Location: Right Arm, Patient Position: Sitting,  Cuff Size: Normal)   Pulse 76   Resp 16   Wt 190 lb (86.2 kg)   LMP  (LMP Unknown)   BMI 32.61 kg/m     General appearance: alert, cooperative and appears stated age Head: Normocephalic, without obvious abnormality, atraumatic Neck: supple, symmetrical, trachea midline and thyroid normal to inspection and palpation Lungs: clear to auscultation bilaterally Heart: regular rate and rhythm Abdomen: soft, non-tender, no masses,  no organomegaly Extremities: extremities normal, atraumatic, no cyanosis or edema Skin: Skin color, texture, turgor normal. No rashes or lesions No abnormal inguinal nodes palpated Neurologic: Grossly normal  Pelvic: External genitalia:  no lesions              Urethra:  normal appearing urethra with no masses, tenderness or lesions              Bartholins and Skenes: normal                 Vagina: normal appearing vagina with normal color and discharge, no lesions              Cervix: no lesions.  Minimal old blood noted.                Bimanual Exam:  Uterus:  normal size, contour, position, consistency, mobility, non-tender              Adnexa: no mass, fullness, tenderness              Rectal exam: Yes.  .  Confirms.              Anus:  normal sphincter tone, no lesions  Chaperone was present for exam.  ASSESSMENT  Abnormal uterine bleeding.  On HRT.   PLAN  Discussed abnormal uterine bleeding and potential etiologies - polyp, infection, hyperplasia, malignancy.  Pap and HR HPV testing done today.  FSH and estradiol.  Return for sonohysterogram and EMB.  Procedures and rationale discussed.  She declines EMB today.   An After Visit Summary was printed and given to the patient.  _25_____ minutes face to face time of which over 50% was spent in counseling.

## 2016-12-02 NOTE — Patient Instructions (Signed)
Abnormal Uterine Bleeding Abnormal uterine bleeding can affect women at various stages in life, including teenagers, women in their reproductive years, pregnant women, and women who have reached menopause. Several kinds of uterine bleeding are considered abnormal, including:  Bleeding or spotting between periods.  Bleeding after sexual intercourse.  Bleeding that is heavier or more than normal.  Periods that last longer than usual.  Bleeding after menopause. Many cases of abnormal uterine bleeding are minor and simple to treat, while others are more serious. Any type of abnormal bleeding should be evaluated by your health care provider. Treatment will depend on the cause of the bleeding. Follow these instructions at home: Monitor your condition for any changes. The following actions may help to alleviate any discomfort you are experiencing:  Avoid the use of tampons and douches as directed by your health care provider.  Change your pads frequently. You should get regular pelvic exams and Pap tests. Keep all follow-up appointments for diagnostic tests as directed by your health care provider. Contact a health care provider if:  Your bleeding lasts more than 1 week.  You feel dizzy at times. Get help right away if:  You pass out.  You are changing pads every 15 to 30 minutes.  You have abdominal pain.  You have a fever.  You become sweaty or weak.  You are passing large blood clots from the vagina.  You start to feel nauseous and vomit. This information is not intended to replace advice given to you by your health care provider. Make sure you discuss any questions you have with your health care provider. Document Released: 07/25/2005 Document Revised: 01/06/2016 Document Reviewed: 02/21/2013 Elsevier Interactive Patient Education  2017 Elsevier Inc.  

## 2016-12-03 LAB — FOLLICLE STIMULATING HORMONE: FSH: 38.9 m[IU]/mL

## 2016-12-03 LAB — ESTRADIOL: ESTRADIOL: 40 pg/mL

## 2016-12-05 ENCOUNTER — Telehealth: Payer: Self-pay

## 2016-12-05 NOTE — Telephone Encounter (Signed)
Phone call to patient.  Discussed labs which indicate menopause.  I do recommend she proceed forward with evaluation for postmenopausal bleeding.  She is bleeding on her HRT for weeks now. States she needs relaxing medication like Valium for the procedures in office.  Wants a paracervical block as well for the EMB.  We will proceed with precerting and scheduling.  I would like to have her return within the next 2 weeks to complete her evaluation.  We talked about the importance of ruling out precancer and cancer. She is in agreement.  She will sign her consent form in advance of taking the medication.  I am recommending Xanax 0.5 mg one hour prior to procedure. No Rx given yet.   She will take Anaprox one hour prior to appointment as well.   She will have a driver take her home.

## 2016-12-05 NOTE — Telephone Encounter (Signed)
Viewed by Nadine Counts on 12/03/2016  8:26 PM   Left message to call Dewayne Jurek at [phone] to scheduled SHGM and EMB.  Notes recorded by Nunzio Cobbs, MD on 12/03/2016 at 7:54 PM EDT Results to patient through My Chart.  Hello Ms. Jaclyn Murray,   I am sharing your hormonal blood work with you.  Your [place] level is in a menopausal range.  Your estrogen is in a premenopausal range, but the estrogen you are on can affect this number.   I do recommend we proceed forward with further evaluation for postmenopausal bleeding.  The sonohysterogram and endometrial biopsy will be helpful for [place] to make a diagnosis and a plan to help you.   The office will contact you to schedule this appointment.   Josefa Half, MD

## 2016-12-05 NOTE — Telephone Encounter (Signed)
Spoke with patient. Patient states she has reviewed MyChart message and would like to know if she can adjust her Estradiol dosage and see if this helps with her estrogen level before having SHGM and EMB? States she has stopped bleeding. Advised labs are indicating she is postmenopausal and any type of bleeding is abnormal when in this stage and requires further evaluation. Patient declines to move forward without speaking with Dr.Silva about medication first. Also states she cannot schedule until June due to her current schedule.

## 2016-12-06 LAB — IPS PAP TEST WITH HPV

## 2016-12-06 NOTE — Telephone Encounter (Signed)
Routing to [employer] for [employer].   Rx for Xanax to be written after the patient has scheduled. Patient will need to pick this up in office and sign consent form at the time of pick up.

## 2016-12-06 NOTE — Telephone Encounter (Signed)
Spoke with patient regarding benefit for a sonohysterogram and endometrial biopsy. Patient understood and agreeable. Patient ready to schedule. Patient scheduled 12/15/16 with Dr Quincy Simmonds. Patient aware of date, arrival time and cancellation policy.   I advised patient, I will forward appointment information to Dr Quincy Simmonds and our Triage Nurse, to review for medication purpose.  Routing to Dr Quincy Simmonds  cc: Verline Lema Sprague

## 2016-12-07 ENCOUNTER — Other Ambulatory Visit: Payer: Self-pay | Admitting: Obstetrics and Gynecology

## 2016-12-07 MED ORDER — ALPRAZOLAM 0.5 MG PO TABS: 0.5000 mg | ORAL_TABLET | Freq: Every evening | ORAL | 0 refills | Status: DC | PRN

## 2016-12-07 MED ORDER — ALPRAZOLAM 0.5 MG PO TABS: ORAL_TABLET | ORAL | 0 refills | Status: DC

## 2016-12-07 NOTE — Telephone Encounter (Signed)
Left message to call Atwell Mcdanel at [phone]. 

## 2016-12-07 NOTE — Telephone Encounter (Signed)
Rx written and will leave with you for patient to receive when she comes in to sign consent form.

## 2016-12-09 NOTE — Telephone Encounter (Signed)
Spoke with patient. Advised rx is ready for pick up. Will need to come by the office before her appointment on 12/15/2016 to sign consent forms and pick up rx. Patient is agreeable and verbalizes understanding.  Routing to provider for final review. Patient agreeable to disposition. Will close encounter.

## 2016-12-09 NOTE — Telephone Encounter (Signed)
Left message to call Kaitlyn at [phone]. 

## 2016-12-15 ENCOUNTER — Encounter: Payer: Self-pay | Admitting: Obstetrics and Gynecology

## 2016-12-15 ENCOUNTER — Other Ambulatory Visit: Payer: Self-pay | Admitting: Obstetrics and Gynecology

## 2016-12-15 ENCOUNTER — Ambulatory Visit (INDEPENDENT_AMBULATORY_CARE_PROVIDER_SITE_OTHER): Payer: BLUE CROSS/BLUE SHIELD | Admitting: Obstetrics and Gynecology

## 2016-12-15 ENCOUNTER — Ambulatory Visit (INDEPENDENT_AMBULATORY_CARE_PROVIDER_SITE_OTHER): Payer: BLUE CROSS/BLUE SHIELD

## 2016-12-15 VITALS — BP 140/90 | HR 64 | Ht 64.0 in | Wt 185.0 lb

## 2016-12-15 DIAGNOSIS — N939 Abnormal uterine and vaginal bleeding, unspecified: Secondary | ICD-10-CM

## 2016-12-15 DIAGNOSIS — D250 Submucous leiomyoma of uterus: Secondary | ICD-10-CM

## 2016-12-15 DIAGNOSIS — E063 Autoimmune thyroiditis: Secondary | ICD-10-CM

## 2016-12-15 LAB — CBC
HEMATOCRIT: 36.7 % (ref 35.0–45.0)
HEMOGLOBIN: 12.2 g/dL (ref 11.7–15.5)
MCH: 29.6 pg (ref 27.0–33.0)
MCHC: 33.2 g/dL (ref 32.0–36.0)
MCV: 89.1 fL (ref 80.0–100.0)
MPV: 8.9 fL (ref 7.5–12.5)
Platelets: 257 10*3/uL (ref 140–400)
RBC: 4.12 MIL/uL (ref 3.80–5.10)
RDW: 14.2 % (ref 11.0–15.0)
WBC: 6.1 10*3/uL (ref 3.8–10.8)

## 2016-12-15 LAB — T4, FREE: FREE T4: 1 ng/dL (ref 0.8–1.8)

## 2016-12-15 LAB — TSH: TSH: 2.97 m[IU]/L

## 2016-12-15 LAB — T3, FREE: T3 FREE: 2.5 pg/mL (ref 2.3–4.2)

## 2016-12-15 NOTE — Progress Notes (Signed)
Encounter reviewed by Dr. Brook Amundson C. Silva.  

## 2016-12-15 NOTE — Patient Instructions (Addendum)
Endometrial Biopsy, Care After This sheet gives you information about how to care for yourself after your procedure. Your health care provider may also give you more specific instructions. If you have problems or questions, contact your health care provider. What can I expect after the procedure? After the procedure, it is common to have:  Mild cramping.  A small amount of vaginal bleeding for a few days. This is normal. Follow these instructions at home:  Take over-the-counter and prescription medicines only as told by your health care provider.  Do not douche, use tampons, or have sexual intercourse until your health care provider approves.  Return to your normal activities as told by your health care provider. Ask your health care provider what activities are safe for you.  Follow instructions from your health care provider about any activity restrictions, such as restrictions on strenuous exercise or heavy lifting. Contact a health care provider if:  You have heavy bleeding, or bleed for longer than 2 days after the procedure.  You have bad smelling discharge from your vagina.  You have a fever or chills.  You have a burning sensation when urinating or you have difficulty urinating.  You have severe pain in your lower abdomen. Get help right away if:  You have severe cramps in your stomach or back.  You pass large blood clots.  Your bleeding increases.  You become weak or light-headed, or you pass out. Summary  After the procedure, it is common to have mild cramping and a small amount of vaginal bleeding for a few days.  Do not douche, use tampons, or have sexual intercourse until your health care provider approves.  Return to your normal activities as told by your health care provider. Ask your health care provider what activities are safe for you. This information is not intended to replace advice given to you by your health care provider. Make sure you discuss any  questions you have with your health care provider. Document Released: 05/15/2013 Document Revised: 08/10/2016 Document Reviewed: 08/10/2016 Elsevier Interactive Patient Education  2017 Elsevier Inc.   Uterine Fibroids Uterine fibroids are tissue masses (tumors). They are also called leiomyomas. They can develop inside of a woman's womb (uterus). They can grow very large. Fibroids are not cancerous (benign). Most fibroids do not require medical treatment. Follow these instructions at home:  Keep all follow-up visits as told by your doctor. This is important.  Take medicines only as told by your doctor.  If you were prescribed a hormone treatment, take the hormone medicines exactly as told.  Do not take aspirin. It can cause bleeding.  Ask your doctor about taking iron pills and increasing the amount of dark green, leafy vegetables in your diet. These actions can help to boost your blood iron levels.  Pay close attention to your period. Tell your doctor about any changes, such as:  Increased blood flow. This may require you to use more pads or tampons than usual per month.  A change in the number of days that your period lasts per month.  A change in symptoms that come with your period, such as back pain or cramping in your belly area (abdomen). Contact a doctor if:  You have pain in your back or the area between your hip bones (pelvic area) that is not controlled by medicines.  You have pain in your abdomen that is not controlled with medicines.  You have an increase in bleeding between and during periods.  You soak tampons  or pads in a half hour or less.  You feel lightheaded.  You feel extra tired.  You feel weak. Get help right away if:  You pass out (faint).  You have a sudden increase in pelvic pain. This information is not intended to replace advice given to you by your health care provider. Make sure you discuss any questions you have with your health care  provider. Document Released: 08/27/2010 Document Revised: 03/25/2016 Document Reviewed: 01/21/2014 Elsevier Interactive Patient Education  2017 [employer].

## 2016-12-15 NOTE — Progress Notes (Signed)
Patient ID: Jaclyn Murray, female   DOB: July 09, 1966, 51 y.o.   MRN: [id number] GYNECOLOGY  VISIT   HPI: 51 y.o.   Married  Caucasian  female   G0P0 with Patient's last menstrual period was 08/08/2014 (approximate).   here for pelvic ultrasound for abnormal uterine bleeding.    Husband present for discussion after the procedures today.  Seen on 12/02/16 for postmenopausal bleeding on HRT.  Was spotting continuously for one month.  FSH 38.9 and estradiol 40 while on HRT. Using Minivelle  0.0375 mg twice weekly and Progesterone 75 mc daily.  Does not want to stop her hormones.   Took Xanax prior to her visit today and she signed her consent form in advance.  Thyroid has been difficult to control.  Cannot take thyroid replacement per patient.   Has plans for weekend of May 18th and the first weekend in June.   GYNECOLOGIC HISTORY: Patient's last menstrual period was 08/08/2014 (approximate). Contraception: Postmenopausal Menopausal hormone therapy: Vivelle Dot 0.0375 and Prometrium 100mg  Last mammogram:  05/04/16 [facility]  Last pap smear: 12-02-16 Neg:Neg HR HPV; 05-24-13 Neg:Neg HR HPV        OB History    Gravida Para Term Preterm AB Living   0             SAB TAB Ectopic Multiple Live Births                     There are no active problems to display for this patient.   Past Medical History:  Diagnosis Date  . Anemia   . Depression   . Dyspareunia    "dryness"  . Hypertension   . Sinusitis     Past Surgical History:  Procedure Laterality Date  . TONSILLECTOMY AND ADENOIDECTOMY     age 89    Current Outpatient Prescriptions  Medication Sig Dispense Refill  . ALPRAZolam (XANAX) 0.5 MG tablet Take one tablet by mouth one hour prior to procedure. 1 tablet 0  . B Complex-C-E-Zn (B COMPLEX-C-E-ZINC) tablet Take 1 tablet by mouth daily.    . Cholecalciferol (VITAMIN D) 2000 UNITS tablet Take 2,000 Units by mouth 2 (two) times daily.    Marland Kitchen  estradiol (VIVELLE-DOT) 0.0375 MG/24HR Place 1 patch onto the skin 2 (two) times a week.  4  . losartan (COZAAR) 100 MG tablet Take 50 mg by mouth daily.     . Multiple Vitamins-Minerals (MULTIVITAMIN PO) Take by mouth.    . naproxen sodium (ANAPROX) 550 MG tablet Take 500 mg by mouth as needed.     . progesterone (PROMETRIUM) 100 MG capsule TAKE (1) CAPSULE DAILY. (Patient taking differently: Take 75 mg by mouth daily. TAKE (1) CAPSULE DAILY.) 90 capsule 3  . S-Adenosylmethionine (SAM-E COMPLETE PO) Take 1 tablet by mouth daily.    . TURMERIC PO Take by mouth daily.     No current facility-administered medications for this visit.      ALLERGIES: Ace inhibitors; Ciprofloxacin; and Erythromycin  Family History  Problem Relation Age of Onset  . Dementia Mother   . Hypertension Mother   . Parkinson's disease Father   . Diabetes Sister   . Hypertension Sister   . Thyroid disease Sister   . Diabetes Sister   . Hypertension Sister   . Cancer Maternal Grandmother 4       colon ca  . Stroke Maternal Grandmother   . Thyroid disease Maternal Grandmother   . Cancer  Paternal Grandfather 92       colon ca  . Hypertension Maternal Grandfather     Social History   Social History  . Marital status: Married    Spouse name: N/A  . Number of children: N/A  . Years of education: N/A   Occupational History  . Not on file.   Social History Main Topics  . Smoking status: Never Smoker  . Smokeless tobacco: Never Used  . Alcohol use 0.6 oz/week    1 Standard drinks or equivalent per week     Comment: 1 drink per month  . Drug use: No  . Sexual activity: Yes    Partners: Male    Birth control/ protection: Other-see comments     Comment: vasectomy   Other Topics Concern  . Not on file   Social History Narrative  . No narrative on file    ROS:  Pertinent items are noted in HPI.  PHYSICAL EXAMINATION:    BP 140/90 (BP Location: Left Arm, Patient Position: Sitting, Cuff Size:  Large)   Pulse 64   Ht 5\' 4"  (1.626 m)   Wt 185 lb (83.9 kg)   LMP 08/08/2014 (Approximate)   BMI 31.76 kg/m     General appearance: alert, cooperative and appears stated age  Pelvic ultrasound: Submucous fibroid 2 cm.  EMS - 6.12 mm. Normal ovaries.  No free fluid.   Sonohysterogram and EMB Consent for procedures.  Speculum placed.  Hibiclens prep.  Tenaculum to anterior cervical lip.  Paracervical block with 5 cc 1% lidocaine, lot [id number], exp 02/21. Cannula passed through cervix and saline injected into endometrial cavity.  Submucous fibroid confirmed.  Does not appear to be intramural.   EMB performed after reprep with Hibiclens.  Tenaculum back to anterior cervical lip.  Pipelle passed x 2 to 7 cm.  On the third pass, I felt obstruction to the Pipelle passing more than 4 cm. Tissue to pathology.  No complications.  Minimal EBL.  Chaperone was present for exam.  ASSESSMENT  Submucous fibroid.  Bleeding on HRT. Hashimotos thyroiditis.  PLAN  Follow up EMB.  Instructions and precautions given.  We discussed postmenopausal bleeding and uterine fibroid.  We reviewed options for care depending on the biopsy results.  She can stop her HRT and observe the bleeding profile.  We both are not interested in this option.  We reviewed hysteroscopy with myomectomy and laparoscopic hysterectomy with bilateral salpingectomy in detail.  We reviewed risks and benefits of each procedure and differences in recovery.   She received handouts on fibroids, hysteroscopy and hysterectomy from [facility].  She seems very alert today after taking the Xanax, but we did discuss that we will need to review these options at a follow up visit.  Will check TFTs and CBC today.   An After Visit Summary was printed and given to the patient.  __40____ minutes face to face time of which over 50% was spent in counseling.

## 2016-12-21 ENCOUNTER — Telehealth: Payer: Self-pay | Admitting: Obstetrics and Gynecology

## 2016-12-21 NOTE — Telephone Encounter (Signed)
Spoke with patient regarding benefit for recommended surgery. Patient understood information presented. Patient confirmed and is ready to proceed with scheduling. Patient aware this is professional benefit only. Patient aware will be contacted by hospital for separate benefits. Routing to Conservation officer, historic buildings for scheduling  Routing to [employer]

## 2016-12-21 NOTE — Telephone Encounter (Signed)
Call to patient. Surgery date options discussed with patient. Prefers date of 02-07-17. Prefers early or late in the month. Advised will check on availability and call her back. Patient is traveling until 12-26-16.

## 2016-12-27 NOTE — Telephone Encounter (Signed)
Return call from patient. Advised surgery scheduled for 02-07-17 at 1000 at [facility]. Surgery instruction sheet reviewed and printed copy will be mailed to patient to patient with surgery center brochure.Phone number to [facility] provided and can call office if any questions prn.  Routing to provider for final review. Patient agreeable to disposition. Will close encounter.

## 2017-01-20 NOTE — Progress Notes (Signed)
GYNECOLOGY  VISIT   HPI: 51 y.o.   Married  Caucasian  female   G0P0 with Patient's last menstrual period was 08/23/2014 (exact date).   here for  Surgery consult   Had submucous fibroid about 2 cm noted with pelvic ultrasound and sonohysterogram on 12/15/16. EMB limited by fibroid.  On HRT with hx of bleeding which is what prompted her evaluation.   FSH 38.8 and estradiol 40 while on HRT.  Normal CBC and TFTs in office.   GYNECOLOGIC HISTORY: Patient's last menstrual period was 08/23/2014 (exact date). Contraception:  Postmenopausal  Menopausal hormone therapy:  Vivelle Dot 0.0375 and Prometrium 100mg  Last mammogram:  05/04/16 [facility]  Last pap smear: 12-02-16 Neg:Neg HR HPV; 05-24-13 Neg:Neg HR HPV        OB History    Gravida Para Term Preterm AB Living   0             SAB TAB Ectopic Multiple Live Births                     There are no active problems to display for this patient.   Past Medical History:  Diagnosis Date  . Anemia   . Depression   . Dyspareunia    "dryness"  . Hypertension   . Sinusitis     Past Surgical History:  Procedure Laterality Date  . TONSILLECTOMY AND ADENOIDECTOMY     age 42    Current Outpatient Prescriptions  Medication Sig Dispense Refill  . B Complex-C-E-Zn (B COMPLEX-C-E-ZINC) tablet Take 1 tablet by mouth daily.    . Cholecalciferol (VITAMIN D) 2000 UNITS tablet Take 2,000 Units by mouth 2 (two) times daily.    Marland Kitchen estradiol (VIVELLE-DOT) 0.0375 MG/24HR Place 1 patch onto the skin 2 (two) times a week.  4  . losartan (COZAAR) 100 MG tablet Take 50 mg by mouth daily.     . Multiple Vitamins-Minerals (MULTIVITAMIN PO) Take by mouth.    . naproxen sodium (ANAPROX) 550 MG tablet Take 500 mg by mouth as needed.     . Omega-3 Fatty Acids (FISH OIL) 1000 MG CAPS Take 2,000 mg by mouth daily.    . progesterone (PROMETRIUM) 100 MG capsule TAKE (1) CAPSULE DAILY. (Patient taking differently: Take 75 mg by mouth  daily. ) 90 capsule 3  . S-Adenosylmethionine (SAM-E COMPLETE PO) Take 1 tablet by mouth daily.    . TURMERIC PO Take by mouth daily.     No current facility-administered medications for this visit.      ALLERGIES: Ace inhibitors; Ciprofloxacin; and Erythromycin  Family History  Problem Relation Age of Onset  . Dementia Mother   . Hypertension Mother   . Parkinson's disease Father   . Diabetes Sister   . Hypertension Sister   . Thyroid disease Sister   . Diabetes Sister   . Hypertension Sister   . Cancer Maternal Grandmother 64       colon ca  . Stroke Maternal Grandmother   . Thyroid disease Maternal Grandmother   . Cancer Paternal Grandfather 36       colon ca  . Hypertension Maternal Grandfather     Social History   Social History  . Marital status: Married    Spouse name: N/A  . Number of children: N/A  . Years of education: N/A   Occupational History  . Not on file.   Social History Main Topics  . Smoking status: Never Smoker  .  Smokeless tobacco: Never Used  . Alcohol use 0.6 oz/week    1 Standard drinks or equivalent per week     Comment: 1 drink per month  . Drug use: No  . Sexual activity: Yes    Partners: Male    Birth control/ protection: Other-see comments     Comment: vasectomy   Other Topics Concern  . Not on file   Social History Narrative  . No narrative on file    ROS:  Pertinent items are noted in HPI.  PHYSICAL EXAMINATION:    BP 126/80 (BP Location: Right Arm, Patient Position: Sitting, Cuff Size: Large)   Pulse 80   Resp 16   Ht 5\' 4"  (1.626 m)   Wt 186 lb (84.4 kg)   LMP 08/23/2014 (Exact Date)   BMI 31.93 kg/m     General appearance: alert, cooperative and appears stated age Head: Normocephalic, without obvious abnormality, atraumatic Neck: no adenopathy, supple, symmetrical, trachea midline and thyroid normal to inspection and palpation Lungs: clear to auscultation bilaterally Heart: regular rate and rhythm Abdomen:  soft, non-tender, no masses,  no organomegaly Extremities: extremities normal, atraumatic, no cyanosis or edema Skin: Skin color, texture, turgor normal. No rashes or lesions No abnormal inguinal nodes palpated Neurologic: Grossly normal  Pelvic: External genitalia:  no lesions              Urethra:  normal appearing urethra with no masses, tenderness or lesions              Bartholins and Skenes: normal                 Vagina: normal appearing vagina with normal color and discharge, no lesions              Cervix: no lesions                Bimanual Exam:  Uterus:  normal size, contour, position, consistency, mobility, non-tender              Adnexa: no mass, fullness, tenderness             Chaperone was present for exam.  ASSESSMENT  Postmenopausal bleeding on HRT.  Submucous fibroid.   PLAN   Discussion of hysteroscopy with Myosure resection of submucous fibroid, dilation and curettage.  Risks, benefits, and alternatives reviewed. Risks include but are not limited to bleeding, infection, damage to surrounding organs including uterine perforation requiring hospitalization and laparoscopy, pulmonary edema, reaction to anesthesia, DVT, PE, death, need for further treatment and surgery including repeat hysteroscopy, hysterectomy or medical therapy.   Surgical expectations and recovery discussed.  Patient wishes to proceed. Stop HRT in prep for surgery.  She wants to see how she will do off the HRT and may decide not to restart this.  An After Visit Summary was printed and given to the patient.  __25____ minutes face to face time of which over 50% was spent in counseling.

## 2017-01-23 ENCOUNTER — Ambulatory Visit (INDEPENDENT_AMBULATORY_CARE_PROVIDER_SITE_OTHER): Payer: BLUE CROSS/BLUE SHIELD | Admitting: Obstetrics and Gynecology

## 2017-01-23 VITALS — BP 126/80 | HR 80 | Resp 16 | Ht 64.0 in | Wt 186.0 lb

## 2017-01-23 DIAGNOSIS — D250 Submucous leiomyoma of uterus: Secondary | ICD-10-CM | POA: Diagnosis not present

## 2017-01-23 DIAGNOSIS — N950 Postmenopausal bleeding: Secondary | ICD-10-CM | POA: Diagnosis not present

## 2017-01-24 ENCOUNTER — Encounter: Payer: Self-pay | Admitting: Obstetrics and Gynecology

## 2017-01-31 ENCOUNTER — Encounter (HOSPITAL_BASED_OUTPATIENT_CLINIC_OR_DEPARTMENT_OTHER): Payer: Self-pay | Admitting: *Deleted

## 2017-02-01 ENCOUNTER — Encounter (HOSPITAL_BASED_OUTPATIENT_CLINIC_OR_DEPARTMENT_OTHER): Payer: Self-pay | Admitting: *Deleted

## 2017-02-01 DIAGNOSIS — Z888 Allergy status to other drugs, medicaments and biological substances status: Secondary | ICD-10-CM | POA: Insufficient documentation

## 2017-02-01 DIAGNOSIS — N9972 Accidental puncture and laceration of a genitourinary system organ or structure during other procedure: Secondary | ICD-10-CM | POA: Diagnosis not present

## 2017-02-01 DIAGNOSIS — Z6832 Body mass index (BMI) 32.0-32.9, adult: Secondary | ICD-10-CM | POA: Diagnosis not present

## 2017-02-01 DIAGNOSIS — E669 Obesity, unspecified: Secondary | ICD-10-CM | POA: Insufficient documentation

## 2017-02-01 DIAGNOSIS — N950 Postmenopausal bleeding: Secondary | ICD-10-CM | POA: Insufficient documentation

## 2017-02-01 DIAGNOSIS — Y838 Other surgical procedures as the cause of abnormal reaction of the patient, or of later complication, without mention of misadventure at the time of the procedure: Secondary | ICD-10-CM | POA: Diagnosis not present

## 2017-02-01 DIAGNOSIS — F329 Major depressive disorder, single episode, unspecified: Secondary | ICD-10-CM | POA: Diagnosis not present

## 2017-02-01 DIAGNOSIS — Z881 Allergy status to other antibiotic agents status: Secondary | ICD-10-CM | POA: Diagnosis not present

## 2017-02-01 DIAGNOSIS — I10 Essential (primary) hypertension: Secondary | ICD-10-CM | POA: Diagnosis not present

## 2017-02-01 DIAGNOSIS — N840 Polyp of corpus uteri: Secondary | ICD-10-CM | POA: Insufficient documentation

## 2017-02-01 DIAGNOSIS — N882 Stricture and stenosis of cervix uteri: Secondary | ICD-10-CM | POA: Insufficient documentation

## 2017-02-01 DIAGNOSIS — D250 Submucous leiomyoma of uterus: Secondary | ICD-10-CM | POA: Diagnosis not present

## 2017-02-01 NOTE — Progress Notes (Signed)
NPO AFTER MN.  ARRIVE  AT 0830.  NEEDS EKG.  GETTING CBC AND BMET DONE Monday 02-06-2017.  WILL TAKE LOSARTAN AM DOS W/ SIPS OF WATER.

## 2017-02-06 DIAGNOSIS — N950 Postmenopausal bleeding: Secondary | ICD-10-CM | POA: Diagnosis not present

## 2017-02-06 LAB — BASIC METABOLIC PANEL
ANION GAP: 7 (ref 5–15)
BUN: 18 mg/dL (ref 6–20)
CHLORIDE: 106 mmol/L (ref 101–111)
CO2: 27 mmol/L (ref 22–32)
Calcium: 9 mg/dL (ref 8.9–10.3)
Creatinine, Ser: 0.82 mg/dL (ref 0.44–1.00)
GFR calc Af Amer: >60 mL/min (ref >60)
GFR calc non Af Amer: >60 mL/min (ref >60)
GLUCOSE: 95 mg/dL (ref 65–99)
POTASSIUM: 3.7 mmol/L (ref 3.5–5.1)
Sodium: 140 mmol/L (ref 135–145)

## 2017-02-06 LAB — CBC
HEMATOCRIT: 33.5 % — AB (ref 36.0–46.0)
Hemoglobin: 11.8 g/dL — ABNORMAL LOW (ref 12.0–15.0)
MCH: 30.6 pg (ref 26.0–34.0)
MCHC: 35.2 g/dL (ref 30.0–36.0)
MCV: 86.8 fL (ref 78.0–100.0)
Platelets: 257 10*3/uL (ref 150–400)
RBC: 3.86 MIL/uL — AB (ref 3.87–5.11)
RDW: 12.9 % (ref 11.5–15.5)
WBC: 6.6 10*3/uL (ref 4.0–10.5)

## 2017-02-07 ENCOUNTER — Encounter (HOSPITAL_BASED_OUTPATIENT_CLINIC_OR_DEPARTMENT_OTHER)
Admission: RE | Disposition: A | Discharge status: Home / Self Care | Payer: Self-pay | Source: Ambulatory Visit | Attending: Obstetrics and Gynecology

## 2017-02-07 ENCOUNTER — Ambulatory Visit (HOSPITAL_BASED_OUTPATIENT_CLINIC_OR_DEPARTMENT_OTHER): Payer: BLUE CROSS/BLUE SHIELD | Admitting: Certified Registered"

## 2017-02-07 ENCOUNTER — Ambulatory Visit (HOSPITAL_BASED_OUTPATIENT_CLINIC_OR_DEPARTMENT_OTHER)
Admission: RE | Admit: 2017-02-07 | Discharge: 2017-02-07 | Disposition: A | Discharge status: Home / Self Care | Payer: BLUE CROSS/BLUE SHIELD | Source: Ambulatory Visit | Attending: Obstetrics and Gynecology | Admitting: Obstetrics and Gynecology

## 2017-02-07 ENCOUNTER — Encounter (HOSPITAL_BASED_OUTPATIENT_CLINIC_OR_DEPARTMENT_OTHER): Payer: Self-pay | Admitting: Certified Registered"

## 2017-02-07 ENCOUNTER — Other Ambulatory Visit: Payer: Self-pay

## 2017-02-07 DIAGNOSIS — D250 Submucous leiomyoma of uterus: Secondary | ICD-10-CM

## 2017-02-07 DIAGNOSIS — N950 Postmenopausal bleeding: Secondary | ICD-10-CM

## 2017-02-07 DIAGNOSIS — N881 Old laceration of cervix uteri: Secondary | ICD-10-CM | POA: Diagnosis not present

## 2017-02-07 DIAGNOSIS — N840 Polyp of corpus uteri: Secondary | ICD-10-CM | POA: Diagnosis not present

## 2017-02-07 DIAGNOSIS — N882 Stricture and stenosis of cervix uteri: Secondary | ICD-10-CM

## 2017-02-07 HISTORY — DX: Postmenopausal bleeding: N95.0

## 2017-02-07 HISTORY — DX: Autoimmune thyroiditis: E06.3

## 2017-02-07 HISTORY — DX: Submucous leiomyoma of uterus: D25.0

## 2017-02-07 HISTORY — PX: DILATATION & CURETTAGE/HYSTEROSCOPY WITH MYOSURE: SHX6511

## 2017-02-07 HISTORY — DX: Presence of spectacles and contact lenses: Z97.3

## 2017-02-07 HISTORY — DX: Other specified health status: Z78.9

## 2017-02-07 HISTORY — PX: CYSTOSCOPY: SHX5120

## 2017-02-07 SURGERY — DILATATION & CURETTAGE/HYSTEROSCOPY WITH MYOSURE
Anesthesia: General | Site: Vagina

## 2017-02-07 MED ORDER — PROMETHAZINE HCL 25 MG/ML IJ SOLN
6.2500 mg | INTRAMUSCULAR | Status: DC | PRN
  Filled 2017-02-07: qty 1

## 2017-02-07 MED ORDER — FENTANYL CITRATE (PF) 100 MCG/2ML IJ SOLN
INTRAMUSCULAR | Status: AC
  Filled 2017-02-07: qty 2

## 2017-02-07 MED ORDER — OXYCODONE-ACETAMINOPHEN 5-325 MG PO TABS: 2.0000 | ORAL_TABLET | ORAL | 0 refills | Status: DC | PRN

## 2017-02-07 MED ORDER — MIDAZOLAM HCL 2 MG/2ML IJ SOLN
INTRAMUSCULAR | Status: AC
  Filled 2017-02-07: qty 2

## 2017-02-07 MED ORDER — PROPOFOL 10 MG/ML IV BOLUS
INTRAVENOUS | Status: AC
  Filled 2017-02-07: qty 20

## 2017-02-07 MED ORDER — PROPOFOL 10 MG/ML IV BOLUS
INTRAVENOUS | Status: DC | PRN
  Administered 2017-02-07: 150 mg via INTRAVENOUS

## 2017-02-07 MED ORDER — ONDANSETRON HCL 4 MG/2ML IJ SOLN
INTRAMUSCULAR | Status: DC | PRN
  Administered 2017-02-07: 4 mg via INTRAVENOUS

## 2017-02-07 MED ORDER — KETOROLAC TROMETHAMINE 30 MG/ML IJ SOLN
INTRAMUSCULAR | Status: AC
  Filled 2017-02-07: qty 1

## 2017-02-07 MED ORDER — LIDOCAINE 2% (20 MG/ML) 5 ML SYRINGE
INTRAMUSCULAR | Status: AC
  Filled 2017-02-07: qty 5

## 2017-02-07 MED ORDER — DEXAMETHASONE SODIUM PHOSPHATE 10 MG/ML IJ SOLN
INTRAMUSCULAR | Status: DC | PRN
  Administered 2017-02-07: 10 mg via INTRAVENOUS

## 2017-02-07 MED ORDER — FENTANYL CITRATE (PF) 100 MCG/2ML IJ SOLN: INTRAMUSCULAR | Status: DC | PRN

## 2017-02-07 MED ORDER — LACTATED RINGERS IV SOLN
INTRAVENOUS | Status: DC
  Administered 2017-02-07: 09:00:00 via INTRAVENOUS
  Filled 2017-02-07: qty 1000

## 2017-02-07 MED ORDER — SODIUM CHLORIDE 0.9 % IR SOLN
Status: DC | PRN
  Administered 2017-02-07: 3000 mL

## 2017-02-07 MED ORDER — LIDOCAINE HCL 1 % IJ SOLN
INTRAMUSCULAR | Status: DC | PRN
  Administered 2017-02-07: 10 mL

## 2017-02-07 MED ORDER — KETOROLAC TROMETHAMINE 30 MG/ML IJ SOLN
30.0000 mg | Freq: Once | INTRAMUSCULAR | Status: DC
  Filled 2017-02-07: qty 1

## 2017-02-07 MED ORDER — OXYCODONE HCL 5 MG/5ML PO SOLN
5.0000 mg | Freq: Once | ORAL | Status: AC | PRN
  Filled 2017-02-07: qty 5

## 2017-02-07 MED ORDER — MIDAZOLAM HCL 5 MG/5ML IJ SOLN
INTRAMUSCULAR | Status: DC | PRN
  Administered 2017-02-07: 2 mg via INTRAVENOUS

## 2017-02-07 MED ORDER — LIDOCAINE 2% (20 MG/ML) 5 ML SYRINGE
INTRAMUSCULAR | Status: DC | PRN
  Administered 2017-02-07: 40 mg via INTRAVENOUS

## 2017-02-07 MED ORDER — ONDANSETRON HCL 4 MG/2ML IJ SOLN
INTRAMUSCULAR | Status: AC
  Filled 2017-02-07: qty 2

## 2017-02-07 MED ORDER — LIDOCAINE 2% (20 MG/ML) 5 ML SYRINGE: INTRAMUSCULAR | Status: DC | PRN

## 2017-02-07 MED ORDER — DEXAMETHASONE SODIUM PHOSPHATE 10 MG/ML IJ SOLN
INTRAMUSCULAR | Status: AC
  Filled 2017-02-07: qty 1

## 2017-02-07 MED ORDER — MEPERIDINE HCL 25 MG/ML IJ SOLN
6.2500 mg | INTRAMUSCULAR | Status: DC | PRN
  Filled 2017-02-07: qty 1

## 2017-02-07 MED ORDER — OXYCODONE HCL 5 MG PO TABS
ORAL_TABLET | ORAL | Status: AC
  Filled 2017-02-07: qty 1

## 2017-02-07 MED ORDER — DEXTROSE 5 % IV SOLN
2.0000 g | INTRAVENOUS | Status: AC
  Administered 2017-02-07: 2 g via INTRAVENOUS
  Filled 2017-02-07: qty 2

## 2017-02-07 MED ORDER — FENTANYL CITRATE (PF) 100 MCG/2ML IJ SOLN
INTRAMUSCULAR | Status: DC | PRN
Start: 2017-02-07 — End: 2017-02-07
  Administered 2017-02-07: 50 ug via INTRAVENOUS
  Administered 2017-02-07 (×2): 25 ug via INTRAVENOUS

## 2017-02-07 MED ORDER — CEFOTETAN DISODIUM-DEXTROSE 2-2.08 GM-% IV SOLR
INTRAVENOUS | Status: AC
  Filled 2017-02-07: qty 50

## 2017-02-07 MED ORDER — PROPOFOL 10 MG/ML IV BOLUS: INTRAVENOUS | Status: DC | PRN

## 2017-02-07 MED ORDER — HYDROMORPHONE HCL 1 MG/ML IJ SOLN
0.2500 mg | INTRAMUSCULAR | Status: DC | PRN
  Filled 2017-02-07: qty 0.5

## 2017-02-07 MED ORDER — OXYCODONE HCL 5 MG PO TABS
5.0000 mg | ORAL_TABLET | Freq: Once | ORAL | Status: AC | PRN
  Administered 2017-02-07: 5 mg via ORAL
  Filled 2017-02-07: qty 1

## 2017-02-07 MED ORDER — LACTATED RINGERS IV SOLN
INTRAVENOUS | Status: DC
  Filled 2017-02-07: qty 1000

## 2017-02-07 SURGICAL SUPPLY — 33 items
BIPOLAR CUTTING LOOP 21FR (ELECTRODE) ×1
CANISTER SUCT 3000ML PPV (MISCELLANEOUS) ×9 IMPLANT
CATH ROBINSON RED A/P 14FR (CATHETERS) IMPLANT
CATH ROBINSON RED A/P 16FR (CATHETERS) ×6 IMPLANT
COVER BACK TABLE 60X90IN (DRAPES) ×3 IMPLANT
DEVICE MYOSURE LITE (MISCELLANEOUS) IMPLANT
DEVICE MYOSURE REACH (MISCELLANEOUS) IMPLANT
DILATOR CANAL MILEX (MISCELLANEOUS) IMPLANT
DRAPE HYSTEROSCOPY (DRAPE) ×3 IMPLANT
DRAPE LG THREE QUARTER DISP (DRAPES) ×3 IMPLANT
FILTER ARTHROSCOPY CONVERTOR (FILTER) ×3 IMPLANT
GLOVE BIO SURGEON STRL SZ 6.5 (GLOVE) ×9 IMPLANT
GLOVE BIOGEL PI IND STRL 6.5 (GLOVE) ×4 IMPLANT
GLOVE BIOGEL PI IND STRL 7.5 (GLOVE) ×6 IMPLANT
GLOVE BIOGEL PI INDICATOR 6.5 (GLOVE) ×2
GLOVE BIOGEL PI INDICATOR 7.5 (GLOVE) ×3
GOWN STRL REUS W/TWL LRG LVL3 (GOWN DISPOSABLE) ×12 IMPLANT
IV NS IRRIG 3000ML ARTHROMATIC (IV SOLUTION) ×9 IMPLANT
LEGGING LITHOTOMY PAIR STRL (DRAPES) ×3 IMPLANT
LOOP CUTTING BIPOLAR 21FR (ELECTRODE) ×2 IMPLANT
MYOSURE XL FIBROID REM (MISCELLANEOUS)
NEEDLE SPNL 22GX5 LNG QUINC BK (NEEDLE) ×3 IMPLANT
PACK BASIN DAY SURGERY FS (CUSTOM PROCEDURE TRAY) ×3 IMPLANT
PAD OB MATERNITY 4.3X12.25 (PERSONAL CARE ITEMS) ×3 IMPLANT
SEAL ROD LENS SCOPE MYOSURE (ABLATOR) ×3 IMPLANT
SUT VIC AB 0 CT2 27 (SUTURE) ×3 IMPLANT
SYR CONTROL 10ML LL (SYRINGE) ×3 IMPLANT
SYSTEM TISS REMOVAL MYSR XL RM (MISCELLANEOUS) IMPLANT
TOWEL OR 17X24 6PK STRL BLUE (TOWEL DISPOSABLE) ×6 IMPLANT
TUBING AQUILEX INFLOW (TUBING) ×3 IMPLANT
TUBING AQUILEX OUTFLOW (TUBING) ×3 IMPLANT
TUBING TUR DISP (UROLOGICAL SUPPLIES) ×3 IMPLANT
WATER STERILE IRR 500ML POUR (IV SOLUTION) ×3 IMPLANT

## 2017-02-07 NOTE — Brief Op Note (Signed)
02/07/2017  11:45 AM  PATIENT:  Jaclyn Murray  51 y.o. female  PRE-OPERATIVE DIAGNOSIS:  PMB, submucosal fibroid  POST-OPERATIVE DIAGNOSIS:  PMB, submucosal fibroid, cervical laceration  PROCEDURE:  Procedure(s): DILATATION & CURETTAGE/HYSTEROSCOPY WITH   RESECTOSCOPE OF SUBMUCOSAL FIBROID, SUTURING OF CERVICAL LACERATION (N/A) CYSTOSCOPY (N/A)  SURGEON:  Surgeon(s) and Role:    * Amundson Raliegh Ip, MD - Primary  PHYSICIAN ASSISTANT: NA  ASSISTANTS: none   ANESTHESIA:   paracervical block and LMA.  EBL:  Total I/O In: 850 [I.V.:800; IV Piggyback:50] Out: 120 [Urine:105; Blood:15]   NS deficit:  850 cc.   BLOOD ADMINISTERED:none  DRAINS: none   LOCAL MEDICATIONS USED:  LIDOCAINE  and Amount: 10 ml  SPECIMEN:  Source of Specimen:  Submucous fibroid, endometrial curettings.  DISPOSITION OF SPECIMEN:  PATHOLOGY  Murray:  YES  TOURNIQUET:  * No tourniquets in log *  DICTATION: .Note written in [place]: Discharge to home after PACU  PATIENT DISPOSITION:  PACU - hemodynamically stable.   Delay start of Pharmacological VTE agent (>24hrs) due to surgical blood loss or risk of bleeding: not applicable

## 2017-02-07 NOTE — Anesthesia Procedure Notes (Signed)
Procedure Name: LMA Insertion Date/Time: 02/07/2017 9:57 AM Performed by: Lajuana Carry E Pre-anesthesia Checklist: Patient identified, Emergency Drugs available, Suction available, Patient being monitored and Timeout performed Patient Re-evaluated:Patient Re-evaluated prior to inductionOxygen Delivery Method: Circle system utilized Preoxygenation: Pre-oxygenation with 100% oxygen Intubation Type: IV induction Ventilation: Mask ventilation without difficulty LMA: LMA inserted LMA Size: 4.0 Number of attempts: 1 Placement Confirmation: positive ETCO2 and breath sounds checked- equal and bilateral Dental Injury: Teeth and Oropharynx as per pre-operative assessment

## 2017-02-07 NOTE — Op Note (Signed)
OPERATIVE REPORT   PREOPERATIVE DIAGNOSES:   Postmenopausal bleeding, submucous fibroid.  POSTOPERATIVE DIAGNOSES:   Postmenopausal bleeding, submucous fibroid, cervical stenosis, cervical laceration.  PROCEDURE:  Hysteroscopy with dilation and curettage and resection of submucous fibroid, repair of cervical laceration, cystoscopy.   SURGEON:  Lenard Galloway, MD  ANESTHESIA:  LMA, paracervical block with 10 mL of 1% lidocaine.  IV FLUIDS:   850 cc.   EBL:  15 cc.  URINE OUTPUT:  105 cc.  NORMAL SALINE DEFICIT:   850 cc.   COMPLICATIONS:  Cervical laceration from the single tooth tenaculum.  INDICATIONS FOR THE PROCEDURE:     The patient is a 51 year old G44 Caucasian female who presents with postmenopausal bleeding on hormone replacement therapy and ultrasound showing a 2 cm submucous fibroid.  Endometrial biopsy in the office was not able to capture endometrial tissue.  A plan is now made to proceed with a hysteroscopy with dilation and curettage and resection of the submucous fibroid; after risks, benefits and alternatives were reviewed.  FINDINGS:  Exam under anesthesia revealed a small uterus.  The uterus was sounded to 7 cm.  Hysteroscopy showed a 2 cm fibroid attached to the posterior uterine fundus.  Endometrial currettings were scant.  The cervix was stenotic and difficult to dilate.  Cystoscopy at the end of the procedure documented the bladder to be intact throughout 360 degrees, including the bladder dome and trigone.  The ureters were patent bilaterally.   SPECIMENS:  The submucous fibroid and endometrial curettings were sent to Pathology separately. Marland Kitchen  PROCEDURE IN DETAIL:  The patient was reidentified in the preoperative hold area.  She did receive Cefotetan IV for antibiotic prophylaxis and she received TED hose and PAS stockings for DVT prophylaxis.  In the operating room, the patient was placed in the dorsal lithotomy position and then an LMA anesthetic was  introduced.  The patient's lower abdomen, vagina and perineum were sterilely prepped, and the  patient's bladder was catheterized of urine.  She was sterilly draped.  An exam under anesthesia was performed.  A speculum was placed inside the vagina and a single-tooth tenaculum was placed on the anterior cervical lip.  A paracervical block was performed with a total of 10 mL of 1% lidocaine plain.  The uterus was sounded. The cervix was dilated to a #25 Pratt dilator with some difficulty as the  cervix was stenotic and had not previously dilated for childbirth.  The  MyoSure hysteroscope was then inserted inside the uterine cavity under the  continuous infusion of normal saline solution.  It was difficult to get good  visualization, so the 2.9 cm hysteroscope was used which confirmed the  presence of the submucous fibroid. The resectoscope was then used to  perform the myomectomy using bipolar energy on a setting of 3.  The pieces were extracted with a polyp forceps periodically after removing the resectoscope. These tissue specimens were sent to Pathology.  The serrated curette was introduced into the uterine cavity and the endometrium was curetted in all 4 quadrants.  A minimal amount of endometrial curettings was obtained.  This specimen was sent to Pathology separately from the fibroid pieces.  The single-tooth tenaculum lacerated the junction between the anterior cervix and the vagina during the procedure. This was a full thickness mucosal laceration of the mucosa. This was repaired with a running locked suture of 0/0 Vicryl.  There was good hemostasis.    I proceeded with a cystoscopy to confirm the  integrity of the bladder due to  the need for the laceration repair.  The bladder was intact.  Please refer to the findings above.   The patient was awakened and escorted to the recovery room in stable condition after she was cleansed of the sterile prep solution.  All needle,  instrument and  Sponge counts were correct.  Lenard Galloway, MD

## 2017-02-07 NOTE — Anesthesia Postprocedure Evaluation (Signed)
Anesthesia Post Note  Patient: Jaclyn Murray  Procedure(s) Performed: Procedure(s) (LRB): DILATATION & CURETTAGE/HYSTEROSCOPY WITH   RESECTOSCOPE OF SUBMUCOSAL FIBROID, SUTURING OF CERVICAL LACERATION (N/A) CYSTOSCOPY (N/A)     Patient location during evaluation: PACU Anesthesia Type: General Level of consciousness: awake and alert Pain management: pain level controlled Vital Signs Assessment: post-procedure vital signs reviewed and stable Respiratory status: spontaneous breathing, nonlabored ventilation and respiratory function stable Cardiovascular status: blood pressure returned to baseline and stable Postop Assessment: no signs of nausea or vomiting Anesthetic complications: no    Last Vitals:  Vitals:   02/07/17 1200 02/07/17 1215  BP: 140/76 133/72  Pulse: (!) 50 (!) 51  Resp: 12 15  Temp:      Last Pain:  Vitals:   02/07/17 1215  TempSrc:   PainSc: [place]

## 2017-02-07 NOTE — H&P (Signed)
Office Visit   01/23/2017 [place], Everardo All, MD  Obstetrics and Gynecology   Submucous leiomyoma of uterus +1 more  Dx   Pre-op Exam; Referred by Willey Blade, MD  Reason for Visit   Additional Documentation   Vitals:   BP 126/80 (BP Location: Right Arm, Patient Position: Sitting, Cuff Size: Large)   Pulse 80   Resp 16   Ht 5\' 4"  (1.626 m)   Wt 186 lb (84.4 kg)   LMP 08/23/2014 (Exact Date)   BMI 31.93 kg/m   BSA 1.95 m      More Vitals   Flowsheets:   Custom Formula Data,   Infectious Disease Screening,   MEWS Score,   Anthropometrics     Encounter Info:   Billing Info,   History,   Allergies,   Detailed Report     All Notes   Progress Notes by Nunzio Cobbs, MD at 01/23/2017 1:00 PM   Author: Nunzio Cobbs, MD Author Type: Physician Filed: 01/24/2017 5:36 PM  Note Status: Signed Cosign: Cosign Not Required Encounter Date: 01/23/2017  Editor: Nunzio Cobbs, MD (Physician)  Prior Versions: 1. Polly Cobia, CMA (Physiological scientist) at 01/23/2017 1:41 PM - Sign at close encounter   2. Archie Balboa CMA (Certified Medical Assistant) at 01/23/2017 12:44 PM - Sign at close encounter    GYNECOLOGY  VISIT   HPI: 51 y.o.   Married  Caucasian  female   G0P0 with Patient's last menstrual period was 08/23/2014 (exact date).   here for  Surgery consult   Had submucous fibroid about 2 cm noted with pelvic ultrasound and sonohysterogram on 12/15/16. EMB limited by fibroid.  On HRT with hx of bleeding which is what prompted her evaluation.   FSH 38.8 and estradiol 40 while on HRT.  Normal CBC and TFTs in office.   GYNECOLOGIC HISTORY: Patient's last menstrual period was 08/23/2014 (exact date). Contraception:  Postmenopausal  Menopausal hormone therapy:  Vivelle Dot 0.0375 and Prometrium 100mg  Last mammogram:  05/04/16 [facility] Last pap smear:  12-02-16 Neg:Neg HR HPV; 05-24-13 Neg:Neg HR HPV                OB History    Gravida Para Term Preterm AB Living   0             SAB TAB Ectopic Multiple Live Births                     There are no active problems to display for this patient.       Past Medical History:  Diagnosis Date  . Anemia   . Depression   . Dyspareunia    "dryness"  . Hypertension   . Sinusitis          Past Surgical History:  Procedure Laterality Date  . TONSILLECTOMY AND ADENOIDECTOMY     age 12          Current Outpatient Prescriptions  Medication Sig Dispense Refill  . B Complex-C-E-Zn (B COMPLEX-C-E-ZINC) tablet Take 1 tablet by mouth daily.    . Cholecalciferol (VITAMIN D) 2000 UNITS tablet Take 2,000 Units by mouth 2 (two) times daily.    Marland Kitchen estradiol (VIVELLE-DOT) 0.0375 MG/24HR Place 1 patch onto the skin 2 (two) times a week.  4  . losartan (COZAAR) 100 MG tablet Take 50 mg by mouth daily.     Marland Kitchen  Multiple Vitamins-Minerals (MULTIVITAMIN PO) Take by mouth.    . naproxen sodium (ANAPROX) 550 MG tablet Take 500 mg by mouth as needed.     . Omega-3 Fatty Acids (FISH OIL) 1000 MG CAPS Take 2,000 mg by mouth daily.    . progesterone (PROMETRIUM) 100 MG capsule TAKE (1) CAPSULE DAILY. (Patient taking differently: Take 75 mg by mouth daily. ) 90 capsule 3  . S-Adenosylmethionine (SAM-E COMPLETE PO) Take 1 tablet by mouth daily.    . TURMERIC PO Take by mouth daily.     No current facility-administered medications for this visit.      ALLERGIES: Ace inhibitors; Ciprofloxacin; and Erythromycin       Family History  Problem Relation Age of Onset  . Dementia Mother   . Hypertension Mother   . Parkinson's disease Father   . Diabetes Sister   . Hypertension Sister   . Thyroid disease Sister   . Diabetes Sister   . Hypertension Sister   . Cancer Maternal Grandmother 39       colon ca  . Stroke Maternal Grandmother   . Thyroid disease  Maternal Grandmother   . Cancer Paternal Grandfather 51       colon ca  . Hypertension Maternal Grandfather     Social History        Social History  . Marital status: Married    Spouse name: N/A  . Number of children: N/A  . Years of education: N/A      Occupational History  . Not on file.         Social History Main Topics  . Smoking status: Never Smoker  . Smokeless tobacco: Never Used  . Alcohol use 0.6 oz/week    1 Standard drinks or equivalent per week     Comment: 1 drink per month  . Drug use: No  . Sexual activity: Yes    Partners: Male    Birth control/ protection: Other-see comments     Comment: vasectomy       Other Topics Concern  . Not on file      Social History Narrative  . No narrative on file    ROS:  Pertinent items are noted in HPI.  PHYSICAL EXAMINATION:    BP 126/80 (BP Location: Right Arm, Patient Position: Sitting, Cuff Size: Large)   Pulse 80   Resp 16   Ht 5\' 4"  (1.626 m)   Wt 186 lb (84.4 kg)   LMP 08/23/2014 (Exact Date)   BMI 31.93 kg/m     General appearance: alert, cooperative and appears stated age Head: Normocephalic, without obvious abnormality, atraumatic Neck: no adenopathy, supple, symmetrical, trachea midline and thyroid normal to inspection and palpation Lungs: clear to auscultation bilaterally Heart: regular rate and rhythm Abdomen: soft, non-tender, no masses,  no organomegaly Extremities: extremities normal, atraumatic, no cyanosis or edema Skin: Skin color, texture, turgor normal. No rashes or lesions No abnormal inguinal nodes palpated Neurologic: Grossly normal  Pelvic: External genitalia:  no lesions              Urethra:  normal appearing urethra with no masses, tenderness or lesions              Bartholins and Skenes: normal                 Vagina: normal appearing vagina with normal color and discharge, no lesions              Cervix: no  lesions                Bimanual  Exam:  Uterus:  normal size, contour, position, consistency, mobility, non-tender              Adnexa: no mass, fullness, tenderness             Chaperone was present for exam.  ASSESSMENT  Postmenopausal bleeding on HRT.  Submucous fibroid.   PLAN   Discussion of hysteroscopy with Myosure resection of submucous fibroid, dilation and curettage.  Risks, benefits, and alternatives reviewed. Risks include but are not limited to bleeding, infection, damage to surrounding organs including uterine perforation requiring hospitalization and laparoscopy, pulmonary edema, reaction to anesthesia, DVT, PE, death, need for further treatment and surgery including repeat hysteroscopy, hysterectomy or medical therapy.   Surgical expectations and recovery discussed.  Patient wishes to proceed. Stop HRT in prep for surgery.  She wants to see how she will do off the HRT and may decide not to restart this.  An After Visit Summary was printed and given to the patient.  __25____ minutes face to face time of which over 50% was spent in counseling.

## 2017-02-07 NOTE — Anesthesia Preprocedure Evaluation (Signed)
Anesthesia Evaluation  Patient identified by MRN, date of birth, ID band Patient awake    Reviewed: Allergy & Precautions, NPO status , Patient's Chart, lab work & pertinent test results  Airway Mallampati: II  TM Distance: >3 FB Neck ROM: Full    Dental no notable dental hx.    Pulmonary neg pulmonary ROS,    Pulmonary exam normal breath sounds clear to auscultation       Cardiovascular hypertension, Pt. on medications negative cardio ROS Normal cardiovascular exam Rhythm:Regular Rate:Normal     Neuro/Psych Depression negative neurological ROS  negative psych ROS   GI/Hepatic negative GI ROS, Neg liver ROS,   Endo/Other  negative endocrine ROS  Renal/GU negative Renal ROS  negative genitourinary   Musculoskeletal negative musculoskeletal ROS (+)   Abdominal (+) + obese,   Peds negative pediatric ROS (+)  Hematology negative hematology ROS (+)   Anesthesia Other Findings   Reproductive/Obstetrics Postmenopausal bleeding                             Anesthesia Physical Anesthesia Plan  ASA: II  Anesthesia Plan: General   Post-op Pain Management:    Induction: Intravenous  PONV Risk Score and Plan: 4 or greater and Ondansetron, Dexamethasone, Propofol, Midazolam and Treatment may vary due to age or medical condition  Airway Management Planned: LMA  Additional Equipment:   Intra-op Plan:   Post-operative Plan: Extubation in OR  Informed Consent: I have reviewed the patients History and Physical, chart, labs and discussed the procedure including the risks, benefits and alternatives for the proposed anesthesia with the patient or authorized representative who has indicated his/her understanding and acceptance.   Dental advisory given  Plan Discussed with: CRNA  Anesthesia Plan Comments:         Anesthesia Quick Evaluation

## 2017-02-07 NOTE — Discharge Instructions (Signed)
Dynastie,   I was able to completely remove the fibroid!  I did need to put some sutures in your cervix due to a laceration from the instrument that holds the cervix during the fibroid removal.  The sutures dissolve.  I also looked inside your bladder with a camera at the end of the surgery as well due to the laceration near the cervix.  (The cervix and the bladder are near eachother. )  The bladder and urethra were normal!  I am giving you some Percocet in case you need something stronger than your anti-inflammatory medication.   Jaclyn Half, MD  Hysteroscopy, Care After Refer to this sheet in the next few weeks. These instructions provide you with information on caring for yourself after your procedure. Your health care provider may also give you more specific instructions. Your treatment has been planned according to current medical practices, but problems sometimes occur. Call your health care provider if you have any problems or questions after your procedure. What can I expect after the procedure? After your procedure, it is typical to have the following:  You may have some cramping. This normally lasts for a couple days.  You may have bleeding. This can vary from light spotting for a few days to menstrual-like bleeding for 3-7 days.  Follow these instructions at home:  Rest for the first 1-2 days after the procedure.  Only take over-the-counter or prescription medicines as directed by your health care provider. Do not take aspirin. It can increase the chances of bleeding.  Take showers instead of baths for 2 weeks or as directed by your health care provider.  Do not drive for 24 hours or as directed.  Do not drink alcohol while taking pain medicine.  Do not use tampons, douche, or have sexual intercourse for 2 weeks or until your health care provider says it is okay.  Take your temperature twice a day for 4-5 days. Write it down each time.  Follow your health care provider's  advice about diet, exercise, and lifting.  If you develop constipation, you may: ? Take a mild laxative if your health care provider approves. ? Add bran foods to your diet. ? Drink enough fluids to keep your urine clear or pale yellow.  Try to have someone with you or available to you for the first 24-48 hours, especially if you were given a general anesthetic.  Follow up with your health care provider as directed. Contact a health care provider if:  You feel dizzy or lightheaded.  You feel sick to your stomach (nauseous).  You have abnormal vaginal discharge.  You have a rash.  You have pain that is not controlled with medicine. Get help right away if:  You have bleeding that is heavier than a normal menstrual period.  You have a fever.  You have increasing cramps or pain, not controlled with medicine.  You have new belly (abdominal) pain.  You pass out.  You have pain in the tops of your shoulders (shoulder strap areas).  You have shortness of breath. This information is not intended to replace advice given to you by your health care provider. Make sure you discuss any questions you have with your health care provider. Document Released: 05/15/2013 Document Revised: 12/31/2015 Document Reviewed: 02/21/2013 Elsevier Interactive Patient Education  2017 [place] Anesthesia Home Care Instructions  Activity: Get plenty of rest for the remainder of the day. A responsible individual must stay with you for 24 hours  following the procedure.  For the next 24 hours, DO NOT: -Drive a car -Paediatric nurse -Drink alcoholic beverages -Take any medication unless instructed by your physician -Make any legal decisions or sign important papers.  Meals: Start with liquid foods such as gelatin or soup. Progress to regular foods as tolerated. Avoid greasy, spicy, heavy foods. If nausea and/or vomiting occur, drink only clear liquids until the nausea and/or vomiting  subsides. Call your physician if vomiting continues.  Special Instructions/Symptoms: Your throat may feel dry or sore from the anesthesia or the breathing tube placed in your throat during surgery. If this causes discomfort, gargle with warm salt water. The discomfort should disappear within 24 hours.  If you had a scopolamine patch placed behind your ear for the management of post- operative nausea and/or vomiting:  1. The medication in the patch is effective for 72 hours, after which it should be removed.  Wrap patch in a tissue and discard in the trash. Wash hands thoroughly with soap and water. 2. You may remove the patch earlier than 72 hours if you experience unpleasant side effects which may include dry mouth, dizziness or visual disturbances. 3. Avoid touching the patch. Wash your hands with soap and water after contact with the patch.

## 2017-02-07 NOTE — Transfer of Care (Signed)
Immediate Anesthesia Transfer of Care Note  Patient: Jaclyn Murray  Procedure(s) Performed: Procedure(s): DILATATION & CURETTAGE/HYSTEROSCOPY WITH   RESECTOSCOPE OF SUBMUCOSAL FIBROID, SUTURING OF CERVICAL LACERATION (N/A) CYSTOSCOPY (N/A)  Patient Location: PACU  Anesthesia Type:General  Level of Consciousness: awake, oriented, drowsy and patient cooperative  Airway & Oxygen Therapy: Patient Spontanous Breathing and Patient connected to nasal cannula oxygen  Post-op Assessment: Report given to RN and Post -op Vital signs reviewed and stable  Post vital signs: Reviewed and stable  Last Vitals:  Vitals:   02/07/17 1132 02/07/17 1133  BP: 124/80 124/80  Pulse: (!) 108 71  Resp: 13 12  Temp: (!) 36.4 C     Last Pain:  Vitals:   02/07/17 0839  TempSrc: Oral      Patients Stated Pain Goal: 5 (28/11/88 6773)  Complications: No apparent anesthesia complications

## 2017-02-09 ENCOUNTER — Encounter (HOSPITAL_BASED_OUTPATIENT_CLINIC_OR_DEPARTMENT_OTHER): Payer: Self-pay | Admitting: Obstetrics and Gynecology

## 2017-02-13 ENCOUNTER — Telehealth: Payer: Self-pay | Admitting: Obstetrics and Gynecology

## 2017-02-13 NOTE — Telephone Encounter (Signed)
Patient called and rescheduled her 2 week post op from 02/20/17 to 02/22/17 with Dr. Quincy Simmonds.

## 2017-02-13 NOTE — Telephone Encounter (Signed)
Routed to triage in error. Rerouting to Dr. Quincy Simmonds for [place].

## 2017-02-20 ENCOUNTER — Ambulatory Visit: Payer: BLUE CROSS/BLUE SHIELD | Admitting: Obstetrics and Gynecology

## 2017-02-22 ENCOUNTER — Ambulatory Visit (INDEPENDENT_AMBULATORY_CARE_PROVIDER_SITE_OTHER): Payer: BLUE CROSS/BLUE SHIELD | Admitting: Obstetrics and Gynecology

## 2017-02-22 VITALS — BP 140/90 | HR 84 | Resp 16 | Ht 64.0 in | Wt 190.0 lb

## 2017-02-22 DIAGNOSIS — Z9889 Other specified postprocedural states: Secondary | ICD-10-CM

## 2017-02-22 MED ORDER — METRONIDAZOLE 500 MG PO TABS: 500.0000 mg | ORAL_TABLET | Freq: Two times a day (BID) | ORAL | 0 refills | Status: DC

## 2017-02-22 NOTE — Progress Notes (Signed)
GYNECOLOGY  VISIT   HPI: 51 y.o.   Married  Caucasian  female   G0P0 with Patient's last menstrual period was 08/23/2014 (exact date).   here for  Post op from 02/07/17  DILATATION & CURETTAGE/HYSTEROSCOPY WITH  RESECTOSCOPE OF SUBMUCOSAL FIBROID, SUTURING OF CERVICAL LACERATION, Cystoscopy.   Pathology report:  Benign smooth muscle.    Very little pain post op.  Very little bleeding.   Stopped all HRT.  Having hot flashes off her HRT.   GYNECOLOGIC HISTORY: Patient's last menstrual period was 08/23/2014 (exact date). Contraception:  Postmenopausal  Menopausal hormone therapy: none Last mammogram:  05/04/16 [facility] Last pap smear: 12-02-16 Neg:Neg HR HPV; 05-24-13 Neg:Neg HR HPV        OB History    Gravida Para Term Preterm AB Living   0             SAB TAB Ectopic Multiple Live Births                     There are no active problems to display for this patient.   Past Medical History:  Diagnosis Date  . Depression   . Dyspareunia    "dryness"  . Gluten free diet   . Hashimoto's disease    per pt dx 2016 approx.  , unable to tolerate oral thyroid medication, followed by pcp  . Hypertension   . PMB (postmenopausal bleeding)   . Submucous uterine fibroid   . Wears glasses     Past Surgical History:  Procedure Laterality Date  . COLONOSCOPY WITH PROPOFOL  05/2016  . CYSTOSCOPY N/A 02/07/2017   Procedure: CYSTOSCOPY;  Surgeon: Nunzio Cobbs, MD;  [facility];  Service: Gynecology;  Laterality: N/A;  . DILATATION & CURETTAGE/HYSTEROSCOPY WITH MYOSURE N/A 02/07/2017   Procedure: DILATATION & CURETTAGE/HYSTEROSCOPY WITH   RESECTOSCOPE OF SUBMUCOSAL FIBROID, SUTURING OF CERVICAL LACERATION;  Surgeon: Nunzio Cobbs, MD;  Location: [facility];  Service: Gynecology;  Laterality: N/A;  . TONSILLECTOMY AND ADENOIDECTOMY  age 83    Current Outpatient Prescriptions  Medication Sig Dispense  Refill  . losartan (COZAAR) 100 MG tablet Take 50 mg by mouth every morning.     . TURMERIC PO Take by mouth daily.    . B Complex-C-E-Zn (B COMPLEX-C-E-ZINC) tablet Take 1 tablet by mouth daily.    . Cholecalciferol (VITAMIN D) 2000 UNITS tablet Take 2,000 Units by mouth 2 (two) times daily.    Marland Kitchen estradiol (VIVELLE-DOT) 0.0375 MG/24HR Place 1 patch onto the skin 2 (two) times a week. Per pt on hold since 01-24-2017 until after surgery 02-07-2017  4  . Multiple Vitamins-Minerals (MULTIVITAMIN PO) Take by mouth.    . naproxen (NAPROSYN) 500 MG tablet Take 500 mg by mouth as needed.    . Omega-3 Fatty Acids (FISH OIL) 1000 MG CAPS Take 2,000 mg by mouth daily.    Marland Kitchen oxyCODONE-acetaminophen (PERCOCET) 5-325 MG tablet Take 2 tablets by mouth every 4 (four) hours as needed. use only as much as needed to relieve pain (Patient not taking: Reported on 02/22/2017) 15 tablet 0  . S-Adenosylmethionine (SAM-E COMPLETE PO) Take 1 tablet by mouth daily.     No current facility-administered medications for this visit.      ALLERGIES: Ace inhibitors; Ciprofloxacin; Corn-containing products; Wheat bran; and Erythromycin  Family History  Problem Relation Age of Onset  . Dementia Mother   . Hypertension Mother   .  Parkinson's disease Father   . Diabetes Sister   . Hypertension Sister   . Thyroid disease Sister   . Diabetes Sister   . Hypertension Sister   . Cancer Maternal Grandmother 78       colon ca  . Stroke Maternal Grandmother   . Thyroid disease Maternal Grandmother   . Cancer Paternal Grandfather 36       colon ca  . Hypertension Maternal Grandfather     Social History   Social History  . Marital status: Married    Spouse name: N/A  . Number of children: N/A  . Years of education: N/A   Occupational History  . Not on file.   Social History Main Topics  . Smoking status: Never Smoker  . Smokeless tobacco: Never Used  . Alcohol use 0.6 oz/week    1 Standard drinks or equivalent  per week     Comment: 1 drink per month  . Drug use: No  . Sexual activity: Yes    Partners: Male    Birth control/ protection: Other-see comments, Post-menopausal     Comment: vasectomy   Other Topics Concern  . Not on file   Social History Narrative  . No narrative on file    ROS:  Pertinent items are noted in HPI.  PHYSICAL EXAMINATION:    BP 140/90 (BP Location: Right Arm, Patient Position: Sitting, Cuff Size: Normal)   Pulse 84   Resp 16   Ht 5\' 4"  (1.626 m)   Wt 190 lb (86.2 kg)   LMP 08/23/2014 (Exact Date)   BMI 32.61 kg/m     General appearance: alert, cooperative and appears stated age   Pelvic: External genitalia:  no lesions              Urethra:  normal appearing urethra with no masses, tenderness or lesions              Bartholins and Skenes: normal                 Vagina: normal appearing vagina with normal color and discharge, no lesions              Cervix:  Suture along the anterior cervix.  Looks like some inflammation and loosening of the suture.                Bimanual Exam:  Uterus:  normal size, contour, position, consistency, mobility, non-tender              Adnexa: no mass, fullness, tenderness              Chaperone was present for exam.  ASSESSMENT  Status post hysteroscopic myomectomy and cervical laceration repair.  Looks like some inflammation along the sutures.  Cervical stenosis.  Recent discontinuation of HRT.   PLAN  Flagyl 500 mg po bid for 1 week.  No heavy activity, coitus, or tub bath yet.  I discussed the laceration repair with the patient.   I expect she may need some treatment for granulation tissue as she heals, but we will follow to see if this is necessary.  Follow up in one week.    An After Visit Summary was printed and given to the patient.

## 2017-02-22 NOTE — Patient Instructions (Signed)
Please do not place anything in the vagina and avoid heavy lifting and exercise until you have completely healed from surgery.   You will take the Flagyl 500 mg by mouth twice a day for one week.  Do not mix this with alcohol.

## 2017-02-24 ENCOUNTER — Encounter: Payer: Self-pay | Admitting: Obstetrics and Gynecology

## 2017-03-01 ENCOUNTER — Encounter: Payer: Self-pay | Admitting: Obstetrics and Gynecology

## 2017-03-01 ENCOUNTER — Ambulatory Visit (INDEPENDENT_AMBULATORY_CARE_PROVIDER_SITE_OTHER): Payer: BLUE CROSS/BLUE SHIELD | Admitting: Obstetrics and Gynecology

## 2017-03-01 VITALS — BP 116/70 | HR 80 | Resp 16 | Wt 190.0 lb

## 2017-03-01 DIAGNOSIS — Z9889 Other specified postprocedural states: Secondary | ICD-10-CM

## 2017-03-01 NOTE — Patient Instructions (Signed)
No tub baths and nothing in the vagina until after your next recheck appointment.  We may start to use some vaginal estrogen cream after the next visit.

## 2017-03-01 NOTE — Progress Notes (Signed)
GYNECOLOGY  VISIT   HPI: 51 y.o.   Married  Caucasian  female   G0P0 with Patient's last menstrual period was 08/23/2014 (exact date).   here for  1 week follow-up recheck of healing of cervical/vaginal laceration from tenaculum at time of hysteroscopic myomectomy.   Patient had erythema of the suturing site at her last office visit.  Prescribed a course of Flagyl. Flagyl caused headache, loose stools, nausea.   No pelvic pain.  No vaginal bleeding.   GYNECOLOGIC HISTORY: Patient's last menstrual period was 08/23/2014 (exact date). Contraception:  Postmenopausal Menopausal hormone therapy: none Last mammogram:  05/04/16 [facility] Last pap smear: 12-02-16 Neg:Neg HR HPV; 05-24-13 Neg:Neg HR HPV        OB History    Gravida Para Term Preterm AB Living   0             SAB TAB Ectopic Multiple Live Births                     There are no active problems to display for this patient.   Past Medical History:  Diagnosis Date  . Depression   . Dyspareunia    "dryness"  . Gluten free diet   . Hashimoto's disease    per pt dx 2016 approx.  , unable to tolerate oral thyroid medication, followed by pcp  . Hypertension   . PMB (postmenopausal bleeding)   . Submucous uterine fibroid   . Wears glasses     Past Surgical History:  Procedure Laterality Date  . COLONOSCOPY WITH PROPOFOL  05/2016  . CYSTOSCOPY N/A 02/07/2017   Procedure: CYSTOSCOPY;  Surgeon: Nunzio Cobbs, MD;  Location: [facility];  Service: Gynecology;  Laterality: N/A;  . DILATATION & CURETTAGE/HYSTEROSCOPY WITH MYOSURE N/A 02/07/2017   Procedure: DILATATION & CURETTAGE/HYSTEROSCOPY WITH   RESECTOSCOPE OF SUBMUCOSAL FIBROID, SUTURING OF CERVICAL LACERATION;  Surgeon: Nunzio Cobbs, MD;  Location: [facility];  Service: Gynecology;  Laterality: N/A;  . TONSILLECTOMY AND ADENOIDECTOMY  age 21    Current Outpatient Prescriptions  Medication  Sig Dispense Refill  . B Complex-C-E-Zn (B COMPLEX-C-E-ZINC) tablet Take 1 tablet by mouth daily.    . Cholecalciferol (VITAMIN D) 2000 UNITS tablet Take 2,000 Units by mouth 2 (two) times daily.    Marland Kitchen losartan (COZAAR) 100 MG tablet Take 50 mg by mouth every morning.     . metroNIDAZOLE (FLAGYL) 500 MG tablet Take 1 tablet (500 mg total) by mouth 2 (two) times daily. 14 tablet 0  . Multiple Vitamins-Minerals (MULTIVITAMIN PO) Take by mouth.    . naproxen (NAPROSYN) 500 MG tablet Take 500 mg by mouth as needed.    . Omega-3 Fatty Acids (FISH OIL) 1000 MG CAPS Take 2,000 mg by mouth daily.    Marland Kitchen oxyCODONE-acetaminophen (PERCOCET) 5-325 MG tablet Take 2 tablets by mouth every 4 (four) hours as needed. use only as much as needed to relieve pain 15 tablet 0  . S-Adenosylmethionine (SAM-E COMPLETE PO) Take 1 tablet by mouth daily.    . TURMERIC PO Take by mouth daily.     No current facility-administered medications for this visit.      ALLERGIES: Ace inhibitors; Ciprofloxacin; Corn-containing products; Wheat bran; and Erythromycin  Family History  Problem Relation Age of Onset  . Dementia Mother   . Hypertension Mother   . Parkinson's disease Father   . Diabetes Sister   .  Hypertension Sister   . Thyroid disease Sister   . Diabetes Sister   . Hypertension Sister   . Cancer Maternal Grandmother 14       colon ca  . Stroke Maternal Grandmother   . Thyroid disease Maternal Grandmother   . Cancer Paternal Grandfather 55       colon ca  . Hypertension Maternal Grandfather     Social History   Social History  . Marital status: Married    Spouse name: N/A  . Number of children: N/A  . Years of education: N/A   Occupational History  . Not on file.   Social History Main Topics  . Smoking status: Never Smoker  . Smokeless tobacco: Never Used  . Alcohol use 0.6 oz/week    1 Standard drinks or equivalent per week     Comment: 1 drink per month  . Drug use: No  . Sexual activity:  Yes    Partners: Male    Birth control/ protection: Other-see comments, Post-menopausal     Comment: vasectomy   Other Topics Concern  . Not on file   Social History Narrative  . No narrative on file    ROS:  Pertinent items are noted in HPI.  PHYSICAL EXAMINATION:    BP 116/70 (BP Location: Right Arm, Patient Position: Sitting, Cuff Size: Normal)   Pulse 80   Resp 16   Wt 190 lb (86.2 kg)   LMP 08/23/2014 (Exact Date)   BMI 32.61 kg/m     General appearance: alert, cooperative and appears stated age   Pelvic: External genitalia:  no lesions              Urethra:  normal appearing urethra with no masses, tenderness or lesions              Bartholins and Skenes: normal                 Vagina:  No lesions.               Cervix: suture present along the anterior cervical vaginal junction -loosening.  No erythema.  Seeing some of the underlying tissue.  Looks improved.                 Bimanual Exam:  Uterus:  normal size, contour, position, consistency, mobility, non-tender              Adnexa: no mass, fullness, tenderness        Chaperone was present for exam.  ASSESSMENT  Status post hysteroscopic myomectomy.  Cervical/vaginal junction laceration. Cellulitis improved.   PLAN  Will recheck in 4 weeks.  I recommend no tub baths and no vaginal intercourse. We talked about the possibility of needing vaginal estrogen cream to help with the final healing after her next office visit.     An After Visit Summary was printed and given to the patient.  __15____ minutes face to face time of which over 50% was spent in counseling.

## 2017-03-29 ENCOUNTER — Encounter: Payer: Self-pay | Admitting: Obstetrics and Gynecology

## 2017-03-29 ENCOUNTER — Ambulatory Visit (INDEPENDENT_AMBULATORY_CARE_PROVIDER_SITE_OTHER): Payer: BLUE CROSS/BLUE SHIELD | Admitting: Obstetrics and Gynecology

## 2017-03-29 VITALS — BP 122/76 | HR 70 | Ht 64.0 in | Wt 191.8 lb

## 2017-03-29 DIAGNOSIS — N952 Postmenopausal atrophic vaginitis: Secondary | ICD-10-CM

## 2017-03-29 DIAGNOSIS — N951 Menopausal and female climacteric states: Secondary | ICD-10-CM

## 2017-03-29 MED ORDER — NYSTATIN-TRIAMCINOLONE 100000-0.1 UNIT/GM-% EX OINT: 1.0000 "application " | TOPICAL_OINTMENT | Freq: Two times a day (BID) | CUTANEOUS | 0 refills | Status: DC

## 2017-03-29 MED ORDER — ESTRADIOL 0.1 MG/GM VA CREA: TOPICAL_CREAM | VAGINAL | 0 refills | Status: DC

## 2017-03-29 MED ORDER — GABAPENTIN 100 MG PO CAPS: 100.0000 mg | ORAL_CAPSULE | Freq: Every day | ORAL | 1 refills | Status: DC

## 2017-03-29 NOTE — Patient Instructions (Signed)
Gabapentin capsules or tablets What is this medicine? GABAPENTIN (GA ba pen tin) is used to control partial seizures in adults with epilepsy. It is also used to treat certain types of nerve pain. This medicine may be used for other purposes; ask your health care provider or pharmacist if you have questions. COMMON BRAND NAME(S): Active-PAC with Gabapentin, Gabarone, Neurontin What should I tell my health care provider before I take this medicine? They need to know if you have any of these conditions: -kidney disease -suicidal thoughts, plans, or attempt; a previous suicide attempt by you or a family member -an unusual or allergic reaction to gabapentin, other medicines, foods, dyes, or preservatives -pregnant or trying to get pregnant -breast-feeding How should I use this medicine? Take this medicine by mouth with a glass of water. Follow the directions on the prescription label. You can take it with or without food. If it upsets your stomach, take it with food.Take your medicine at regular intervals. Do not take it more often than directed. Do not stop taking except on your doctor's advice. If you are directed to break the 600 or 800 mg tablets in half as part of your dose, the extra half tablet should be used for the next dose. If you have not used the extra half tablet within 28 days, it should be thrown away. A special MedGuide will be given to you by the pharmacist with each prescription and refill. Be sure to read this information carefully each time. Talk to your pediatrician regarding the use of this medicine in children. Special care may be needed. Overdosage: If you think you have taken too much of this medicine contact a poison control center or emergency room at once. NOTE: This medicine is only for you. Do not share this medicine with others. What if I miss a dose? If you miss a dose, take it as soon as you can. If it is almost time for your next dose, take only that dose. Do not  take double or extra doses. What may interact with this medicine? Do not take this medicine with any of the following medications: -other gabapentin products This medicine may also interact with the following medications: -alcohol -antacids -antihistamines for allergy, cough and cold -certain medicines for anxiety or sleep -certain medicines for depression or psychotic disturbances -homatropine; hydrocodone -naproxen -narcotic medicines (opiates) for pain -phenothiazines like chlorpromazine, mesoridazine, prochlorperazine, thioridazine This list may not describe all possible interactions. Give your health care provider a list of all the medicines, herbs, non-prescription drugs, or dietary supplements you use. Also tell them if you smoke, drink alcohol, or use illegal drugs. Some items may interact with your medicine. What should I watch for while using this medicine? Visit your doctor or health care professional for regular checks on your progress. You may want to keep a record at home of how you feel your condition is responding to treatment. You may want to share this information with your doctor or health care professional at each visit. You should contact your doctor or health care professional if your seizures get worse or if you have any new types of seizures. Do not stop taking this medicine or any of your seizure medicines unless instructed by your doctor or health care professional. Stopping your medicine suddenly can increase your seizures or their severity. Wear a medical identification bracelet or chain if you are taking this medicine for seizures, and carry a card that lists all your medications. You may get drowsy, dizzy,  or have blurred vision. Do not drive, use machinery, or do anything that needs mental alertness until you know how this medicine affects you. To reduce dizzy or fainting spells, do not sit or stand up quickly, especially if you are an older patient. Alcohol can  increase drowsiness and dizziness. Avoid alcoholic drinks. Your mouth may get dry. Chewing sugarless gum or sucking hard candy, and drinking plenty of water will help. The use of this medicine may increase the chance of suicidal thoughts or actions. Pay special attention to how you are responding while on this medicine. Any worsening of mood, or thoughts of suicide or dying should be reported to your health care professional right away. Women who become pregnant while using this medicine may enroll in the [place] Pregnancy Registry by calling [phone]. This registry collects information about the safety of antiepileptic drug use during pregnancy. What side effects may I notice from receiving this medicine? Side effects that you should report to your doctor or health care professional as soon as possible: -allergic reactions like skin rash, itching or hives, swelling of the face, lips, or tongue -worsening of mood, thoughts or actions of suicide or dying Side effects that usually do not require medical attention (report to your doctor or health care professional if they continue or are bothersome): -constipation -difficulty walking or controlling muscle movements -dizziness -nausea -slurred speech -tiredness -tremors -weight gain This list may not describe all possible side effects. Call your doctor for medical advice about side effects. You may report side effects to FDA at 1-800-FDA-1088. Where should I keep my medicine? Keep out of reach of children. This medicine may cause accidental overdose and death if it taken by other adults, children, or pets. Mix any unused medicine with a substance like cat litter or coffee grounds. Then throw the medicine away in a sealed container like a sealed bag or a coffee can with a lid. Do not use the medicine after the expiration date. Store at room temperature between 15 and 30 degrees C (59 and 86 degrees F). NOTE: This  sheet is a summary. It may not cover all possible information. If you have questions about this medicine, talk to your doctor, pharmacist, or health care provider.  2018 Elsevier/Gold Standard (2013-09-20 15:26:50)  Menopause and Herbal Products  The most popular product is called Estroven.   What is menopause? Menopause is the normal time of life when menstrual periods decrease in frequency and eventually stop completely. This process can take several years for some women. Menopause is complete when you have had an absence of menstruation for a full year since your last menstrual period. It usually occurs between the ages of 42 and 14. It is not common for menopause to begin before the age of 48. During menopause, your body stops producing the female hormones estrogen and progesterone. Common symptoms associated with this loss of hormones (vasomotor symptoms) are:  Hot flashes.  Hot flushes.  Night sweats.  Other common symptoms and complications of menopause include:  Decrease in sex drive.  Vaginal dryness and thinning of the walls of the vagina. This can make sex painful.  Dryness of the skin and development of wrinkles.  Headaches.  Tiredness.  Irritability.  Memory problems.  Weight gain.  Bladder infections.  Hair growth on the face and chest.  Inability to reproduce offspring (infertility).  Loss of density in the bones (osteoporosis) increasing your risk for breaks (fractures).  Depression.  Hardening and narrowing of  the arteries (atherosclerosis). This increases your risk of heart attack and stroke.  What treatment options are available? There are many treatment choices for menopause symptoms. The most common treatment is hormone replacement therapy. Many alternative therapies for menopause are emerging, including the use of herbal products. These supplements can be found in the form of herbs, teas, oils, tinctures, and pills. Common herbal supplements  for menopause are made from plants that contain phytoestrogens. Phytoestrogens are compounds that occur naturally in plants and plant products. They act like estrogen in the body. Foods and herbs that contain phytoestrogens include:  Soy.  Flax seeds.  Red clover.  Ginseng.  What menopause symptoms may be helped if I use herbal products?  Vasomotor symptoms. These may be helped by: ? Soy. Some studies show that soy may have a moderate benefit for hot flashes. ? Black cohosh. There is limited evidence indicating this may be beneficial for hot flashes.  Symptoms that are related to heart and blood vessel disease. These may be helped by soy. Studies have shown that soy can help to lower cholesterol.  Depression. This may be helped by: ? St. John's wort. There is limited evidence that shows this may help mild to moderate depression. ? Black cohosh. There is evidence that this may help depression and mood swings.  Osteoporosis. Soy may help to decrease bone loss that is associated with menopause and may prevent osteoporosis. Limited evidence indicates that red clover may offer some bone loss protection as well. Other herbal products that are commonly used during menopause lack enough evidence to support their use as a replacement for conventional menopause therapies. These products include evening primrose, ginseng, and red clover. What are the cases when herbal products should not be used during menopause? Do not use herbal products during menopause without your health care provider's approval if:  You are taking medicine.  You have a preexisting liver condition.  Are there any risks in my taking herbal products during menopause? If you choose to use herbal products to help with symptoms of menopause, keep in mind that:  Different supplements have different and unmeasured amounts of herbal ingredients.  Herbal products are not regulated the same way that medicines  are.  Concentrations of herbs may vary depending on the way they are prepared. For example, the concentration may be different in a pill, tea, oil, and tincture.  Little is known about the risks of using herbal products, particularly the risks of long-term use.  Some herbal supplements can be harmful when combined with certain medicines.  Most commonly reported side effects of herbal products are mild. However, if used improperly, many herbal supplements can cause serious problems. Talk to your health care provider before starting any herbal product. If problems develop, stop taking the supplement and let your health care provider know. This information is not intended to replace advice given to you by your health care provider. Make sure you discuss any questions you have with your health care provider. Document Released: 01/11/2008 Document Revised: 06/21/2016 Document Reviewed: 01/07/2014 Elsevier Interactive Patient Education  2017 [employer].

## 2017-03-29 NOTE — Progress Notes (Signed)
GYNECOLOGY  VISIT   HPI: 51 y.o.   Married  Caucasian  female   G0P0 with Patient's last menstrual period was 08/23/2014 (exact date).   here for follow up.   Had cervical laceration from tenaculum with her hysteroscopic myomectomy procedure.  She had significant cervical stenosis.   No vaginal bleeding.   Having hot flashes like crazy.  Affecting her sleep.   Wants refill of Mycolog II.  GYNECOLOGIC HISTORY: Patient's last menstrual period was 08/23/2014 (exact date). Contraception:  Postmenopausal Menopausal hormone therapy:  none Last mammogram:  05/04/16 [facility] Last pap smear: 12-02-16 Neg:Neg HR HPV; 05-24-13 Neg:Neg HR HPV        OB History    Gravida Para Term Preterm AB Living   0             SAB TAB Ectopic Multiple Live Births                     There are no active problems to display for this patient.   Past Medical History:  Diagnosis Date  . Depression   . Dyspareunia    "dryness"  . Gluten free diet   . Hashimoto's disease    per pt dx 2016 approx.  , unable to tolerate oral thyroid medication, followed by pcp  . Hypertension   . PMB (postmenopausal bleeding)   . Submucous uterine fibroid   . Wears glasses     Past Surgical History:  Procedure Laterality Date  . COLONOSCOPY WITH PROPOFOL  05/2016  . CYSTOSCOPY N/A 02/07/2017   Procedure: CYSTOSCOPY;  Surgeon: Nunzio Cobbs, MD;  Location: [facility];  Service: Gynecology;  Laterality: N/A;  . DILATATION & CURETTAGE/HYSTEROSCOPY WITH MYOSURE N/A 02/07/2017   Procedure: DILATATION & CURETTAGE/HYSTEROSCOPY WITH   RESECTOSCOPE OF SUBMUCOSAL FIBROID, SUTURING OF CERVICAL LACERATION;  Surgeon: Nunzio Cobbs, MD;  Location: [facility];  Service: Gynecology;  Laterality: N/A;  . TONSILLECTOMY AND ADENOIDECTOMY  age 28    Current Outpatient Prescriptions  Medication Sig Dispense Refill  . B Complex-C-E-Zn (B COMPLEX-C-E-ZINC)  tablet Take 1 tablet by mouth daily.    . Cholecalciferol (VITAMIN D) 2000 UNITS tablet Take 2,000 Units by mouth 2 (two) times daily.    Marland Kitchen losartan (COZAAR) 100 MG tablet Take 50 mg by mouth every morning.     . Multiple Vitamins-Minerals (MULTIVITAMIN PO) Take by mouth.    . naproxen (NAPROSYN) 500 MG tablet Take 500 mg by mouth as needed.    . nystatin-triamcinolone ointment (MYCOLOG) Apply 1 application topically 2 (two) times daily.    . Omega-3 Fatty Acids (FISH OIL) 1000 MG CAPS Take 2,000 mg by mouth daily.    Marland Kitchen oxyCODONE-acetaminophen (PERCOCET) 5-325 MG tablet Take 2 tablets by mouth every 4 (four) hours as needed. use only as much as needed to relieve pain 15 tablet 0  . S-Adenosylmethionine (SAM-E COMPLETE PO) Take 1 tablet by mouth daily.    . TURMERIC PO Take by mouth daily.     No current facility-administered medications for this visit.      ALLERGIES: Ace inhibitors; Ciprofloxacin; Corn-containing products; Wheat bran; and Erythromycin  Family History  Problem Relation Age of Onset  . Dementia Mother   . Hypertension Mother   . Parkinson's disease Father   . Diabetes Sister   . Hypertension Sister   . Thyroid disease Sister   . Diabetes Sister   .  Hypertension Sister   . Cancer Maternal Grandmother 26       colon ca  . Stroke Maternal Grandmother   . Thyroid disease Maternal Grandmother   . Cancer Paternal Grandfather 51       colon ca  . Hypertension Maternal Grandfather     Social History   Social History  . Marital status: Married    Spouse name: N/A  . Number of children: N/A  . Years of education: N/A   Occupational History  . Not on file.   Social History Main Topics  . Smoking status: Never Smoker  . Smokeless tobacco: Never Used  . Alcohol use 0.6 oz/week    1 Standard drinks or equivalent per week     Comment: 1 drink per month  . Drug use: No  . Sexual activity: Yes    Partners: Male    Birth control/ protection: Other-see comments,  Post-menopausal     Comment: vasectomy   Other Topics Concern  . Not on file   Social History Narrative  . No narrative on file    ROS:  Pertinent items are noted in HPI.  PHYSICAL EXAMINATION:    BP 122/76 (BP Location: Right Arm, Patient Position: Sitting, Cuff Size: Large)   Pulse 70   Ht 5\' 4"  (1.626 m)   Wt 191 lb 12.8 oz (87 kg)   LMP 08/23/2014 (Exact Date)   BMI 32.92 kg/m     General appearance: alert, cooperative and appears stated age  Pelvic: External genitalia:  no lesions              Urethra:  normal appearing urethra with no masses, tenderness or lesions              Bartholins and Skenes: normal                 Vagina: normal appearing vagina with normal color and discharge, no lesions              Cervix:  Small amount of granulation tissue anteriorly.  Tx with AgNO3.                Bimanual Exam:  Uterus:  normal size, contour, position, consistency, mobility, non-tender              Adnexa: no mass, fullness, tenderness               Chaperone was present for exam.  ASSESSMENT  Cervical laceration healing post op.  Menopausal symptoms. Off HRT.  PLAN  Resume normal activity in about 1 week.  Gabapentin 100 mg po q hs for hot flashes.  I discussed side effects.  Refill of Mycolog given to patient.  Follow up for annual exam in 7 weeks.    An After Visit Summary was printed and given to the patient.

## 2017-05-26 ENCOUNTER — Other Ambulatory Visit: Payer: Self-pay | Admitting: Internal Medicine

## 2017-05-26 DIAGNOSIS — Z1231 Encounter for screening mammogram for malignant neoplasm of breast: Secondary | ICD-10-CM

## 2017-05-29 ENCOUNTER — Other Ambulatory Visit: Payer: Self-pay | Admitting: Obstetrics and Gynecology

## 2017-05-29 NOTE — Telephone Encounter (Signed)
Medication refill request: Neurontin  Last AEX:  06-20-14  Last OV: 03-29-17  Next AEX: 06-05-17 Last MMG (if hormonal medication request): 05-04-16 WNL  Refill authorized: please advise

## 2017-06-02 NOTE — Progress Notes (Signed)
51 y.o. G0P0 Married Caucasian female here for annual exam.    No vaginal bleeding or spotting.  Tried Estrace cream but did not like it.  Doing Ok off HRT. Gabapentin is helping.  Takes 100 mg at hs.  Hot flashes during the day are controlled.  Weight loss is helping.  Lost 22 pounds through diet and exercise.  ROS - planned weight loss, loss of urine with sneeze/cough occasionally, night time urination due to increased fluid intake at hs, muscle/joint pain status post fall this weekend, skin rash under arms and is using Nystatin/trimacinolone and saw dermatology for this in the last month.  Does labs with PCP.   PCP:   Willey Blade   Patient's last menstrual period was 08/23/2014 (exact date).           Sexually active: Yes.    The current method of family planning is post menopausal status.    Exercising: Yes.    walking Smoker:  no  Health Maintenance: Pap: 12/02/16 negative, HR HPV negative, 05-24-13 Neg:Neg HR HPV History of abnormal Pap: ? Yes,  2000 Hx of colposcopy MMG: 05-04-16 Density A/Neg/BiRads1:TBC--APPT.06-14-17 Colonoscopy: 05/2016 with Dr. Collene Mares, polyps per patient  BMD:   never  Result  n/a TDaP:  2010 Gardasil:   no HIV: never Hep C: done with PCP   Screening Labs:  Hb today: PCP, Urine today: PCP   reports that she has never smoked. She has never used smokeless tobacco. She reports that she drinks about 0.6 oz of alcohol per week . She reports that she does not use drugs.  Past Medical History:  Diagnosis Date  . Depression   . Dyspareunia    "dryness"  . Gluten free diet   . Hashimoto's disease    per pt dx 2016 approx.  , unable to tolerate oral thyroid medication, followed by pcp  . Hypertension   . PMB (postmenopausal bleeding)   . Submucous uterine fibroid   . Wears glasses     Past Surgical History:  Procedure Laterality Date  . COLONOSCOPY WITH PROPOFOL  05/2016  . CYSTOSCOPY N/A 02/07/2017   Procedure: CYSTOSCOPY;  Surgeon: Nunzio Cobbs, MD;  Location: [facility];  Service: Gynecology;  Laterality: N/A;  . DILATATION & CURETTAGE/HYSTEROSCOPY WITH MYOSURE N/A 02/07/2017   Procedure: DILATATION & CURETTAGE/HYSTEROSCOPY WITH   RESECTOSCOPE OF SUBMUCOSAL FIBROID, SUTURING OF CERVICAL LACERATION;  Surgeon: Nunzio Cobbs, MD;  Location: [facility];  Service: Gynecology;  Laterality: N/A;  . TONSILLECTOMY AND ADENOIDECTOMY  age 77    Current Outpatient Prescriptions  Medication Sig Dispense Refill  . B Complex-C-E-Zn (B COMPLEX-C-E-ZINC) tablet Take 1 tablet by mouth daily.    . Cholecalciferol (VITAMIN D) 2000 UNITS tablet Take 2,000 Units by mouth 2 (two) times daily.    Marland Kitchen gabapentin (NEURONTIN) 100 MG capsule TAKE 1 CAPSULE AT BEDTIME. 30 capsule 0  . losartan (COZAAR) 100 MG tablet Take 50 mg by mouth every morning.     . Multiple Vitamins-Minerals (MULTIVITAMIN PO) Take by mouth.    . naproxen (NAPROSYN) 500 MG tablet Take 500 mg by mouth as needed.    . nystatin-triamcinolone ointment (MYCOLOG) Apply 1 application topically 2 (two) times daily. Use as need for one week at a time. 30 g 0  . Omega-3 Fatty Acids (FISH OIL) 1000 MG CAPS Take 2,000 mg by mouth daily.    . S-Adenosylmethionine (SAM-E COMPLETE PO) Take 1 tablet by mouth  daily.    . TURMERIC PO Take by mouth daily.     No current facility-administered medications for this visit.     Family History  Problem Relation Age of Onset  . Dementia Mother   . Hypertension Mother   . Parkinson's disease Father   . Diabetes Sister   . Hypertension Sister   . Thyroid disease Sister   . Diabetes Sister   . Hypertension Sister   . Cancer Maternal Grandmother 68       colon ca  . Stroke Maternal Grandmother   . Thyroid disease Maternal Grandmother   . Cancer Paternal Grandfather 66       colon ca  . Hypertension Maternal Grandfather     ROS:  Pertinent items are noted in HPI.  Otherwise, a comprehensive ROS  was negative.  Exam:   BP 108/72 (BP Location: Right Arm, Patient Position: Sitting, Cuff Size: Normal)   Pulse 74   Resp 12   Ht 5\' 4"  (1.626 m)   Wt 171 lb (77.6 kg)   LMP 08/23/2014 (Exact Date)   BMI 29.35 kg/m     General appearance: alert, cooperative and appears stated age Head: Normocephalic, without obvious abnormality, atraumatic Neck: no adenopathy, supple, symmetrical, trachea midline and thyroid normal to inspection and palpation Lungs: clear to auscultation bilaterally Breasts: normal appearance, no masses or tenderness, No nipple retraction or dimpling, No nipple discharge or bleeding, No axillary or supraclavicular adenopathy Heart: regular rate and rhythm Abdomen: soft, non-tender; no masses, no organomegaly Extremities: extremities normal, atraumatic, no cyanosis or edema Skin: Skin color, texture, turgor normal. No rashes or lesions Lymph nodes: Cervical, supraclavicular, and axillary nodes normal. No abnormal inguinal nodes palpated Neurologic: Grossly normal  Pelvic: External genitalia:  no lesions              Urethra:  normal appearing urethra with no masses, tenderness or lesions              Bartholins and Skenes: normal                 Vagina: normal appearing vagina with normal color and discharge, no lesions              Cervix: no lesions              Pap taken: No. Bimanual Exam:  Uterus:  normal size, contour, position, consistency, mobility, non-tender              Adnexa: no mass, fullness, tenderness              Rectal exam: Yes.  .  Confirms.              Anus:  normal sphincter tone, no lesions  Chaperone was present for exam.  Assessment:   Well woman visit with normal exam. Remote hx of abnormal pap.  Night sweats controlled on Gabapentin 100 mg q hs.  Rash under arms.  Plan: Mammogram screening discussed. Recommended self breast awareness. Pap and HR HPV as above. Guidelines for Calcium, Vitamin D, regular exercise program  including cardiovascular and weight bearing exercise. Refill Gabapentin 100 mg q hs #90, RF 3.  Refill Nystatin/Triamcinolone.  If needs something stronger, will need to get through Dermatology. Does not need refill of vaginal estrogen.  Chooses to stay off of this. Labs with PCP.  Follow up yearly and prn.    Follow up annually and prn.    After visit summary provided.

## 2017-06-05 ENCOUNTER — Ambulatory Visit (INDEPENDENT_AMBULATORY_CARE_PROVIDER_SITE_OTHER): Payer: BLUE CROSS/BLUE SHIELD | Admitting: Obstetrics and Gynecology

## 2017-06-05 ENCOUNTER — Encounter: Payer: Self-pay | Admitting: Obstetrics and Gynecology

## 2017-06-05 VITALS — BP 108/72 | HR 74 | Resp 12 | Ht 64.0 in | Wt 171.0 lb

## 2017-06-05 DIAGNOSIS — Z01419 Encounter for gynecological examination (general) (routine) without abnormal findings: Secondary | ICD-10-CM

## 2017-06-05 MED ORDER — GABAPENTIN 100 MG PO CAPS: 100.0000 mg | ORAL_CAPSULE | Freq: Every day | ORAL | 3 refills | Status: DC

## 2017-06-05 MED ORDER — NYSTATIN-TRIAMCINOLONE 100000-0.1 UNIT/GM-% EX OINT: 1.0000 "application " | TOPICAL_OINTMENT | Freq: Two times a day (BID) | CUTANEOUS | 0 refills | Status: DC

## 2017-06-05 NOTE — Patient Instructions (Signed)

## 2017-06-14 ENCOUNTER — Ambulatory Visit
Admission: RE | Admit: 2017-06-14 | Discharge: 2017-06-14 | Disposition: A | Discharge status: Home / Self Care | Payer: BLUE CROSS/BLUE SHIELD | Source: Ambulatory Visit | Attending: Internal Medicine | Admitting: Internal Medicine

## 2017-06-14 DIAGNOSIS — Z1231 Encounter for screening mammogram for malignant neoplasm of breast: Secondary | ICD-10-CM

## 2017-06-21 ENCOUNTER — Telehealth: Payer: Self-pay | Admitting: Obstetrics and Gynecology

## 2017-06-21 MED ORDER — GABAPENTIN 100 MG PO CAPS: ORAL_CAPSULE | ORAL | 4 refills | Status: DC

## 2017-06-21 NOTE — Telephone Encounter (Signed)
Spoke with patient. Patient states that she was given rx for Gabapentin 100 mg daily. States she was advised by Dr.Silva originally if she was still having symptoms could increase her dose to 200 mg daily. States increased her dose to 200 mg daily and feels much better. As a result she is almost out. Asking for new rx to be written for Gabapentin 200 mg daily.  Routing to Dr.Silva for review.

## 2017-06-21 NOTE — Telephone Encounter (Signed)
Ok to refill the Gabapentin 200 mg daily until her next annual exam is due. Thank you.

## 2017-06-21 NOTE — Telephone Encounter (Signed)
Patient is almost out of her gabapentin because she is taking 200 mg and her bottle says 100 mg. [facility] pharmacy phone number [phone].

## 2017-06-21 NOTE — Telephone Encounter (Signed)
Rx for Gabapentin 100 mg take 200 mg daily #60 4RF sent to pharmacy on file. Patient is aware and is agreeable. Encounter closed.

## 2017-11-27 ENCOUNTER — Other Ambulatory Visit: Payer: Self-pay | Admitting: Obstetrics and Gynecology

## 2017-11-27 NOTE — Telephone Encounter (Signed)
Medication refill request: Gabapentin 100mg  #25 Last AEX:  06-05-17 Next AEX: none Last MMG (if hormonal medication request): 06-14-17 [id number] Refill authorized: please advise

## 2018-02-26 ENCOUNTER — Telehealth: Payer: Self-pay | Admitting: Obstetrics and Gynecology

## 2018-02-26 NOTE — Telephone Encounter (Signed)
Take Gabapentin 100 mg q hs for 2 weeks and then stop.

## 2018-02-26 NOTE — Telephone Encounter (Signed)
Spoke with patient. Would like to stop Gabapentin. Currently taking 200 mg PO QHS. Has 100 mg tablets.  Did not take her medication for two days and did not feel symptoms. Would like to try to come off.   Advised patient will send message to Dr. Quincy Simmonds and return call with response. Pt agreeable.

## 2018-02-26 NOTE — Telephone Encounter (Signed)
Patient would like to "stop taking gabapentin" and is asking for instructions.

## 2018-02-27 NOTE — Telephone Encounter (Signed)
Spoke with patient, advised as seen below per Dr. Silva.  Patient verbalizes understanding and is agreeable.  Encounter closed.  

## 2018-04-10 ENCOUNTER — Telehealth: Payer: Self-pay | Admitting: Obstetrics and Gynecology

## 2018-04-10 NOTE — Telephone Encounter (Signed)
Spoke with patient. Patient reports rash on vulva for the past several weeks. Feels like "diaper rash". Itching and uncomfortable. Denies vaginal d/c, odor or bleeding. Has tried nystatin ointment and preparation H with no relief. Leaving to go out of country next week.   Recommended OV for further evaluation. Patient declined OV today and  9/5, patient request to schedule with any provider on 9/4. Advised will review schedule with Dr. Quincy Simmonds and return call. Advised patient Dr. Quincy Simmonds is not in the office today, response may not be immediate, patient agreeable.

## 2018-04-10 NOTE — Telephone Encounter (Signed)
Reviewed with Dr. Quincy Simmonds. Call returned to patient. OV scheduled for 9/4 at 1pm with Dr. Quincy Simmonds. Patient verbalizes understanding.  Routing to provider for final review. Patient is agreeable to disposition. Will close encounter.

## 2018-04-10 NOTE — Telephone Encounter (Signed)
Patient has a rash in vaginal area that is itchy and painful.

## 2018-04-11 ENCOUNTER — Encounter: Payer: Self-pay | Admitting: Obstetrics and Gynecology

## 2018-04-11 ENCOUNTER — Ambulatory Visit (INDEPENDENT_AMBULATORY_CARE_PROVIDER_SITE_OTHER): Payer: BLUE CROSS/BLUE SHIELD | Admitting: Obstetrics and Gynecology

## 2018-04-11 VITALS — BP 136/74 | HR 80 | Temp 98.7°F | Resp 16 | Ht 64.0 in | Wt 153.0 lb

## 2018-04-11 DIAGNOSIS — N760 Acute vaginitis: Secondary | ICD-10-CM

## 2018-04-11 DIAGNOSIS — R300 Dysuria: Secondary | ICD-10-CM | POA: Diagnosis not present

## 2018-04-11 LAB — POCT URINALYSIS DIPSTICK
Bilirubin, UA: NEGATIVE
Blood, UA: NEGATIVE
Glucose, UA: NEGATIVE
Leukocytes, UA: NEGATIVE
NITRITE UA: NEGATIVE
Protein, UA: NEGATIVE
UROBILINOGEN UA: 0.2 U/dL
pH, UA: 7 (ref 5.0–8.0)

## 2018-04-11 MED ORDER — BETAMETHASONE VALERATE 0.1 % EX OINT: 1.0000 "application " | TOPICAL_OINTMENT | Freq: Two times a day (BID) | CUTANEOUS | 0 refills | Status: DC

## 2018-04-11 NOTE — Progress Notes (Signed)
GYNECOLOGY  VISIT   HPI: 52 y.o.   Married  Caucasian  female   G0P0 with Patient's last menstrual period was 08/23/2014 (exact date).   here for vaginal itching and discomfort. Patient complains of burning with urination  "I am uncomfortable, and I am going to [place] on Monday." Notes vaginal and perineal redness and inflammation.  Vaginal itching and Nystatin not helping.  (Mycolog not helping.) Feels swollen.  Feels like she has diaper rash and is using zinc in her anal region.  No recent abx or steroids.  No trips to the beach or in moist clothing.   A couple of times had burning with urination.  This has resolved now.   Not using vaginal estrogen due to messiness. Not having intercourse due to pain.  No vaginal bleeding.   Urine: 2+ Ketones, 7 pH  GYNECOLOGIC HISTORY: Patient's last menstrual period was 08/23/2014 (exact date). Contraception:  Postmenopausal Menopausal hormone therapy:  none Last mammogram:  06/14/17 BIRADS 1 negative/density a Last pap smear:   12/02/16 Neg:Neg HR HPV        OB History    Gravida  0   Para      Term      Preterm      AB      Living        SAB      TAB      Ectopic      Multiple      Live Births                 There are no active problems to display for this patient.   Past Medical History:  Diagnosis Date  . Depression   . Dyspareunia    "dryness"  . Gluten free diet   . Hashimoto's disease    per pt dx 2016 approx.  , unable to tolerate oral thyroid medication, followed by pcp  . Hypertension   . PMB (postmenopausal bleeding)   . Submucous uterine fibroid   . Wears glasses     Past Surgical History:  Procedure Laterality Date  . COLONOSCOPY WITH PROPOFOL  05/2016  . CYSTOSCOPY N/A 02/07/2017   Procedure: CYSTOSCOPY;  Surgeon: Nunzio Cobbs, MD;  Location: [facility];  Service: Gynecology;  Laterality: N/A;  . DILATATION & CURETTAGE/HYSTEROSCOPY WITH MYOSURE N/A  02/07/2017   Procedure: DILATATION & CURETTAGE/HYSTEROSCOPY WITH   RESECTOSCOPE OF SUBMUCOSAL FIBROID, SUTURING OF CERVICAL LACERATION;  Surgeon: Nunzio Cobbs, MD;  Location: [facility];  Service: Gynecology;  Laterality: N/A;  . TONSILLECTOMY AND ADENOIDECTOMY  age 59    Current Outpatient Medications  Medication Sig Dispense Refill  . B Complex-C-E-Zn (B COMPLEX-C-E-ZINC) tablet Take 1 tablet by mouth daily.    . Cholecalciferol (VITAMIN D) 2000 UNITS tablet Take 2,000 Units by mouth 2 (two) times daily.    Marland Kitchen losartan (COZAAR) 25 MG tablet Take 25 mg by mouth every morning.     . Magnesium 300 MG CAPS Take by mouth daily.    . Multiple Vitamins-Minerals (MULTIVITAMIN PO) Take by mouth.    . naproxen (NAPROSYN) 500 MG tablet Take 500 mg by mouth as needed.    . nystatin-triamcinolone ointment (MYCOLOG) Apply 1 application topically 2 (two) times daily. Use as need for one week at a time. 30 g 0  . Omega-3 Fatty Acids (FISH OIL) 1000 MG CAPS Take 2,000 mg by mouth daily.    . Probiotic Product (  PROBIOTIC ADVANCED PO) UltraFlora Balance  Take 1 capsule daily    . S-Adenosylmethionine (SAM-E COMPLETE PO) Take 1 tablet by mouth daily.    . TURMERIC PO Take by mouth daily.    . betamethasone valerate ointment (VALISONE) 0.1 % Apply 1 application topically 2 (two) times daily. Use for 2 weeks at a time as needed. 30 g 0   No current facility-administered medications for this visit.      ALLERGIES: Ace inhibitors; Ciprofloxacin; Corn-containing products; Wheat bran; and Erythromycin  Family History  Problem Relation Age of Onset  . Dementia Mother   . Hypertension Mother   . Parkinson's disease Father   . Diabetes Sister   . Hypertension Sister   . Thyroid disease Sister   . Diabetes Sister   . Hypertension Sister   . Cancer Maternal Grandmother 18       colon ca  . Stroke Maternal Grandmother   . Thyroid disease Maternal Grandmother   . Cancer Paternal  Grandfather 73       colon ca  . Hypertension Maternal Grandfather     Social History   Socioeconomic History  . Marital status: Married    Spouse name: Not on file  . Number of children: Not on file  . Years of education: Not on file  . Highest education level: Not on file  Occupational History  . Not on file  Social Needs  . Financial resource strain: Not on file  . Food insecurity:    Worry: Not on file    Inability: Not on file  . Transportation needs:    Medical: Not on file    Non-medical: Not on file  Tobacco Use  . Smoking status: Never Smoker  . Smokeless tobacco: Never Used  Substance and Sexual Activity  . Alcohol use: Yes    Alcohol/week: 1.0 standard drinks    Types: 1 Standard drinks or equivalent per week    Comment: 1 drink per month  . Drug use: No  . Sexual activity: Yes    Partners: Male    Birth control/protection: Other-see comments, Post-menopausal    Comment: vasectomy  Lifestyle  . Physical activity:    Days per week: Not on file    Minutes per session: Not on file  . Stress: Not on file  Relationships  . Social connections:    Talks on phone: Not on file    Gets together: Not on file    Attends religious service: Not on file    Active member of club or organization: Not on file    Attends meetings of clubs or organizations: Not on file    Relationship status: Not on file  . Intimate partner violence:    Fear of current or ex partner: Not on file    Emotionally abused: Not on file    Physically abused: Not on file    Forced sexual activity: Not on file  Other Topics Concern  . Not on file  Social History Narrative  . Not on file    Review of Systems  Gastrointestinal:       Bloating   Genitourinary:       Vulvar itching Burning with urination Night urination  Skin: Positive for rash.  All other systems reviewed and are negative.   PHYSICAL EXAMINATION:    BP 136/74 (BP Location: Right Arm, Patient Position: Sitting,  Cuff Size: Large)   Pulse 80   Temp 98.7 F (37.1 C) (Oral)   Resp  16   Ht 5\' 4"  (1.626 m)   Wt 153 lb (69.4 kg)   LMP 08/23/2014 (Exact Date)   BMI 26.26 kg/m     General appearance: alert, cooperative and appears stated age   Pelvic: External genitalia:  no lesions              Urethra:  normal appearing urethra with no masses, tenderness or lesions              Bartholins and Skenes: normal                 Vagina: normal appearing vagina with normal color and discharge, no lesions              Cervix: no lesions.  Scar anteriorly.                 Bimanual Exam:  Uterus:  normal size, contour, position, consistency, mobility, non-tender              Adnexa: no mass, fullness, tenderness                          Anus:   Perianal erythema.   Chaperone was present for exam.  ASSESSMENT  Vulvovaginitis.  Burning with urination.   PLAN  Valisone ointment twice daily for 2 weeks prn.  Affirm testing. Additional tx to follow.  Return for annual exam and discussion of atrophy treatment.     An After Visit Summary was printed and given to the patient.  _15_____ minutes face to face time of which over 50% was spent in counseling.

## 2018-04-11 NOTE — Patient Instructions (Signed)

## 2018-04-12 LAB — VAGINITIS/VAGINOSIS, DNA PROBE
Candida Species: NEGATIVE
GARDNERELLA VAGINALIS: POSITIVE — AB
Trichomonas vaginosis: NEGATIVE

## 2018-04-12 MED ORDER — METRONIDAZOLE 500 MG PO TABS: 500.0000 mg | ORAL_TABLET | Freq: Two times a day (BID) | ORAL | 0 refills | Status: DC

## 2018-04-12 NOTE — Addendum Note (Signed)
Addended by: Yisroel Ramming, Dietrich Pates E on: 04/12/2018 05:31 PM   Modules accepted: Orders

## 2018-04-13 ENCOUNTER — Telehealth: Payer: Self-pay | Admitting: *Deleted

## 2018-04-13 MED ORDER — CLINDAMYCIN PHOSPHATE 2 % VA CREA: 1.0000 | TOPICAL_CREAM | Freq: Every day | VAGINAL | 0 refills | Status: AC

## 2018-04-13 NOTE — Telephone Encounter (Signed)
Spoke with patient, requesting results from  9/4 OV. Advised as seen below per Dr. Quincy Simmonds. Patient requesting alternative RX for BV. Patient states she is going to [place], requesting a medication that she can take with no ETOH precautions. Advised will review with Dr. Quincy Simmonds and return call, patient agreeable.   Dr. Quincy Simmonds -please advise.       Notes recorded by Nunzio Cobbs, MD on 04/12/2018 at 5:30 PM EDT Results to patient through My Chart.  Hello Ms. Meisinger,   Your vaginitis testing is showing bacterial vaginosis.  I will send a prescription through to your pharmacy for Flagyl which you will take by mouth twice a day for the next 7 days.  Do not drink any alcohol while you are taking the medication or you will develop severe nausea and vomiting.   Please contact the office for any questions.   Have a good evening!  Josefa Half, MD

## 2018-04-13 NOTE — Telephone Encounter (Signed)
Spoke with patient, advised as seen below per Dr. Quincy Simmonds. Rx for clindamycin to verified pharmacy. Patient verbalizes understanding and is agreeable.   Call placed to [facility], spoke with Opal Sidles, RX for flagyl PO cancelled.   Encounter closed.

## 2018-04-13 NOTE — Telephone Encounter (Signed)
Ok for Clindamycin 2% vaginal cream.  One applicator pv at hs for 7 nights.  RF none.

## 2018-06-14 NOTE — Progress Notes (Signed)
52 y.o. G0P0 Married Caucasian female here for annual exam.    Losing weight - keto diet and intermittent fasting.  Working with a Clinical research associate and walking.  Feeling much better.  Hot flashes improved.  Did not like the Gabapentin due to confusion.   Denies vaginal bleeding.   Labs with PCP.   PCP: Willey Blade, MD  Patient's last menstrual period was 08/23/2014 (exact date).           Sexually active: Yes.   female The current method of family planning is post menopausal status.    Exercising: Yes.    weights and walking Smoker:  no  Health Maintenance: Pap: 12-02-16 Neg:Neg HR HPV, 05-24-13 Neg:Neg HR HPV History of abnormal Pap:  Yes, 2000 Hx colposcopy MMG: 06-15-17 Neg/density A/Birads1.    Colonoscopy: 05/2016 polyps;next due 2022 BMD:   n/a  Result  n/a TDaP:  2010 Gardasil:   no HIV: no Hep C: done with PCP Screening Labs:  Hb today: PCP    reports that she has never smoked. She has never used smokeless tobacco. She reports that she drinks about 1.0 standard drinks of alcohol per week. She reports that she does not use drugs.  Past Medical History:  Diagnosis Date  . Depression   . Dyspareunia    "dryness"  . Gluten free diet   . Hashimoto's disease    per pt dx 2016 approx.  , unable to tolerate oral thyroid medication, followed by pcp  . Hypertension   . PMB (postmenopausal bleeding)   . Submucous uterine fibroid   . Wears glasses     Past Surgical History:  Procedure Laterality Date  . COLONOSCOPY WITH PROPOFOL  05/2016  . CYSTOSCOPY N/A 02/07/2017   Procedure: CYSTOSCOPY;  Surgeon: Nunzio Cobbs, MD;  Location: [facility];  Service: Gynecology;  Laterality: N/A;  . DILATATION & CURETTAGE/HYSTEROSCOPY WITH MYOSURE N/A 02/07/2017   Procedure: DILATATION & CURETTAGE/HYSTEROSCOPY WITH   RESECTOSCOPE OF SUBMUCOSAL FIBROID, SUTURING OF CERVICAL LACERATION;  Surgeon: Nunzio Cobbs, MD;  Location: [place];   Service: Gynecology;  Laterality: N/A;  . TONSILLECTOMY AND ADENOIDECTOMY  age 12    Current Outpatient Medications  Medication Sig Dispense Refill  . B Complex-C-E-Zn (B COMPLEX-C-E-ZINC) tablet Take 1 tablet by mouth daily.    . betamethasone valerate ointment (VALISONE) 0.1 % Apply 1 application topically 2 (two) times daily. Use for 2 weeks at a time as needed. 30 g 0  . Cholecalciferol (VITAMIN D) 2000 UNITS tablet Take 2,000 Units by mouth 2 (two) times daily.    . Magnesium 300 MG CAPS Take by mouth daily.    . Multiple Vitamins-Minerals (MULTIVITAMIN PO) Take by mouth.    . naproxen (NAPROSYN) 500 MG tablet Take 500 mg by mouth as needed.    . nystatin-triamcinolone ointment (MYCOLOG) Apply 1 application topically 2 (two) times daily. Use as need for one week at a time. 30 g 0  . Omega-3 Fatty Acids (FISH OIL) 1000 MG CAPS Take 2,000 mg by mouth daily.    . Probiotic Product (PROBIOTIC ADVANCED PO) UltraFlora Balance  Take 1 capsule daily    . S-Adenosylmethionine (SAM-E COMPLETE PO) Take 1 tablet by mouth daily.    . TURMERIC PO Take by mouth daily.     No current facility-administered medications for this visit.     Family History  Problem Relation Age of Onset  . Dementia Mother   . Hypertension  Mother   . Parkinson's disease Father   . Diabetes Sister   . Hypertension Sister   . Thyroid disease Sister   . Diabetes Sister   . Hypertension Sister   . Cancer Maternal Grandmother 1       colon ca  . Stroke Maternal Grandmother   . Thyroid disease Maternal Grandmother   . Cancer Paternal Grandfather 63       colon ca  . Hypertension Maternal Grandfather     Review of Systems  Genitourinary:       Nocturia  Musculoskeletal:       Joint aches  All other systems reviewed and are negative.   Exam:   BP 132/80 (BP Location: Right Arm, Patient Position: Sitting, Cuff Size: Normal)   Pulse 68   Resp 14   Ht 5\' 4"  (1.626 m)   Wt 155 lb 9.6 oz (70.6 kg)   LMP  08/23/2014 (Exact Date)   BMI 26.71 kg/m     General appearance: alert, cooperative and appears stated age Head: Normocephalic, without obvious abnormality, atraumatic Neck: no adenopathy, supple, symmetrical, trachea midline and thyroid normal to inspection and palpation Lungs: clear to auscultation bilaterally Breasts: normal appearance, no masses or tenderness, No nipple retraction or dimpling, No nipple discharge or bleeding, No axillary or supraclavicular adenopathy Heart: regular rate and rhythm Abdomen: soft, non-tender; no masses, no organomegaly Extremities: extremities normal, atraumatic, no cyanosis or edema Skin: Skin color, texture, turgor normal. No rashes or lesions Lymph nodes: Cervical, supraclavicular, and axillary nodes normal. No abnormal inguinal nodes palpated Neurologic: Grossly normal  Pelvic: External genitalia:  no lesions              Urethra:  normal appearing urethra with no masses, tenderness or lesions              Bartholins and Skenes: normal                 Vagina: normal appearing vagina with normal color and discharge, no lesions              Cervix: no lesions              Pap taken: No. Bimanual Exam:  Uterus:  normal size, contour, position, consistency, mobility, non-tender              Adnexa: no mass, fullness, tenderness              Rectal exam: Yes.  .  Confirms.              Anus:  normal sphincter tone, no lesions  Chaperone was present for exam.  Assessment:   Well woman visit with normal exam. Remote hx of abnormal pap.  Vaginal atrophy.  Plan: Mammogram screening.  She will schedule. Recommended self breast awareness. Pap and HR HPV as above. Guidelines for Calcium, Vitamin D, regular exercise program including cardiovascular and weight bearing exercise. Rx for Vagifem. Instructed in use.  Discussed potential effect on breast cancer. Flu vaccine discussed.  Follow up annually and prn.   After visit summary provided.

## 2018-06-15 ENCOUNTER — Ambulatory Visit (INDEPENDENT_AMBULATORY_CARE_PROVIDER_SITE_OTHER): Payer: BLUE CROSS/BLUE SHIELD | Admitting: Obstetrics and Gynecology

## 2018-06-15 ENCOUNTER — Encounter: Payer: Self-pay | Admitting: Obstetrics and Gynecology

## 2018-06-15 ENCOUNTER — Other Ambulatory Visit: Payer: Self-pay

## 2018-06-15 VITALS — BP 132/80 | HR 68 | Resp 14 | Ht 64.0 in | Wt 155.6 lb

## 2018-06-15 DIAGNOSIS — Z01419 Encounter for gynecological examination (general) (routine) without abnormal findings: Secondary | ICD-10-CM | POA: Diagnosis not present

## 2018-06-15 MED ORDER — ESTRADIOL 10 MCG VA TABS: ORAL_TABLET | VAGINAL | 11 refills | Status: DC

## 2018-06-15 NOTE — Patient Instructions (Signed)

## 2018-06-18 ENCOUNTER — Telehealth: Payer: Self-pay | Admitting: Obstetrics and Gynecology

## 2018-06-18 NOTE — Telephone Encounter (Signed)
Call placed to pharmacy, spoke with [place]. Was advised patient has high deductible plan, out of pocket cost for 3 month supply of Vagifem $300. Imvexxy would be approximately $192 for 3 mo supply with Good RX coupon.    Dr. Quincy Simmonds -please advise on alternative RX.

## 2018-06-18 NOTE — Telephone Encounter (Signed)
Patient called with questions about her estrogen. She said it will cost about $300.00.Marland Kitchen She then said her pharmacy said Imvexxy may be an alternative and that they have coupons for her.

## 2018-06-19 NOTE — Telephone Encounter (Signed)
Patient returned call

## 2018-06-19 NOTE — Telephone Encounter (Signed)
Call to patient. RN advised patient alternative prescription request had been sent to Dr. Quincy Simmonds for review. Advised would return call to patient with Dr. Elza Rafter recommendations once reviewed. Patient agreeable.

## 2018-06-20 NOTE — Telephone Encounter (Signed)
To reduce cost, she may want to consider compounded vaginal estradiol cream 0.02%.  Place 1/2 gram per vagina at hs twice weekly.  Dispense:  8 gram tube, RF 11.   This will need to come from a compounding pharmacy like [facility] or [facility].  Either pharmacy can inform her about the cost of this option.

## 2018-06-20 NOTE — Telephone Encounter (Signed)
Left message to call Briyana Badman, RN at [facility] [phone].   

## 2018-06-21 NOTE — Telephone Encounter (Signed)
Patient returned call. Ok to leave a detailed message if she is not available.

## 2018-06-21 NOTE — Telephone Encounter (Signed)
Spoke with patient. Advised as seen below per Dr. Quincy Simmonds.  Patient declines alternative medication recommended, does not want a vaginal cream, "too messy".   Recommended patient f/u with her insurance provider to review covered alternative to Vagifem, can then review options with Dr. Quincy Simmonds.   Patient asking if the Imvexxy was an option? Advised will review with Dr. Quincy Simmonds and return call.

## 2018-06-22 NOTE — Telephone Encounter (Signed)
Jaclyn Murray is an option for the patient if she would like I recommend she use 10 mcg pv at hs twice weekly. (She needs to know that this is exactly the same as Vagifem.)

## 2018-06-25 NOTE — Telephone Encounter (Signed)
Spoke with patient, advised as seen below per Dr. Quincy Simmonds. Patient request new order for Imvexxy to [facility].   Rx pended for Imvexxy.   Routing to Dr. Quincy Simmonds to review.

## 2018-06-26 MED ORDER — ESTRADIOL STARTER PACK 10 MCG VA INST: VAGINAL_INSERT | VAGINAL | 11 refills | Status: AC

## 2018-06-26 NOTE — Telephone Encounter (Signed)
[place] for Imvexxy.

## 2018-06-26 NOTE — Telephone Encounter (Signed)
Rx sent. Encounter closed.

## 2018-07-20 ENCOUNTER — Other Ambulatory Visit: Payer: Self-pay | Admitting: Internal Medicine

## 2018-07-20 DIAGNOSIS — Z1231 Encounter for screening mammogram for malignant neoplasm of breast: Secondary | ICD-10-CM

## 2018-07-30 ENCOUNTER — Ambulatory Visit
Admission: RE | Admit: 2018-07-30 | Discharge: 2018-07-30 | Disposition: A | Discharge status: Home / Self Care | Payer: BLUE CROSS/BLUE SHIELD | Source: Ambulatory Visit

## 2018-07-30 DIAGNOSIS — Z1231 Encounter for screening mammogram for malignant neoplasm of breast: Secondary | ICD-10-CM

## 2019-06-04 ENCOUNTER — Other Ambulatory Visit: Payer: Self-pay | Admitting: Internal Medicine

## 2019-06-04 DIAGNOSIS — Z1231 Encounter for screening mammogram for malignant neoplasm of breast: Secondary | ICD-10-CM

## 2019-08-05 ENCOUNTER — Other Ambulatory Visit: Payer: Self-pay

## 2019-08-05 ENCOUNTER — Ambulatory Visit
Admission: RE | Admit: 2019-08-05 | Discharge: 2019-08-05 | Disposition: A | Discharge status: Home / Self Care | Payer: BC Managed Care – PPO | Source: Ambulatory Visit | Attending: Internal Medicine | Admitting: Internal Medicine

## 2019-08-05 DIAGNOSIS — Z1231 Encounter for screening mammogram for malignant neoplasm of breast: Secondary | ICD-10-CM

## 2019-11-25 DIAGNOSIS — I10 Essential (primary) hypertension: Secondary | ICD-10-CM | POA: Diagnosis not present

## 2019-11-25 DIAGNOSIS — M25511 Pain in right shoulder: Secondary | ICD-10-CM | POA: Diagnosis not present

## 2019-12-03 DIAGNOSIS — M25511 Pain in right shoulder: Secondary | ICD-10-CM | POA: Diagnosis not present

## 2019-12-12 DIAGNOSIS — M25511 Pain in right shoulder: Secondary | ICD-10-CM | POA: Diagnosis not present

## 2019-12-24 DIAGNOSIS — M25511 Pain in right shoulder: Secondary | ICD-10-CM | POA: Diagnosis not present

## 2019-12-31 DIAGNOSIS — M25511 Pain in right shoulder: Secondary | ICD-10-CM | POA: Diagnosis not present

## 2020-01-22 DIAGNOSIS — M25511 Pain in right shoulder: Secondary | ICD-10-CM | POA: Diagnosis not present

## 2020-02-05 DIAGNOSIS — M25511 Pain in right shoulder: Secondary | ICD-10-CM | POA: Diagnosis not present

## 2020-02-20 DIAGNOSIS — M25511 Pain in right shoulder: Secondary | ICD-10-CM | POA: Diagnosis not present

## 2020-03-05 DIAGNOSIS — M25511 Pain in right shoulder: Secondary | ICD-10-CM | POA: Diagnosis not present

## 2020-06-16 IMAGING — MG DIGITAL SCREENING BILAT W/ TOMO W/ CAD
6 of 10 series · 6 of 30 positions shown · non-contrast
Comparison: Previous exam(s).

CLINICAL DATA: Screening.

EXAM:
DIGITAL SCREENING BILATERAL MAMMOGRAM WITH TOMO AND CAD

[R MLO synth-2D]
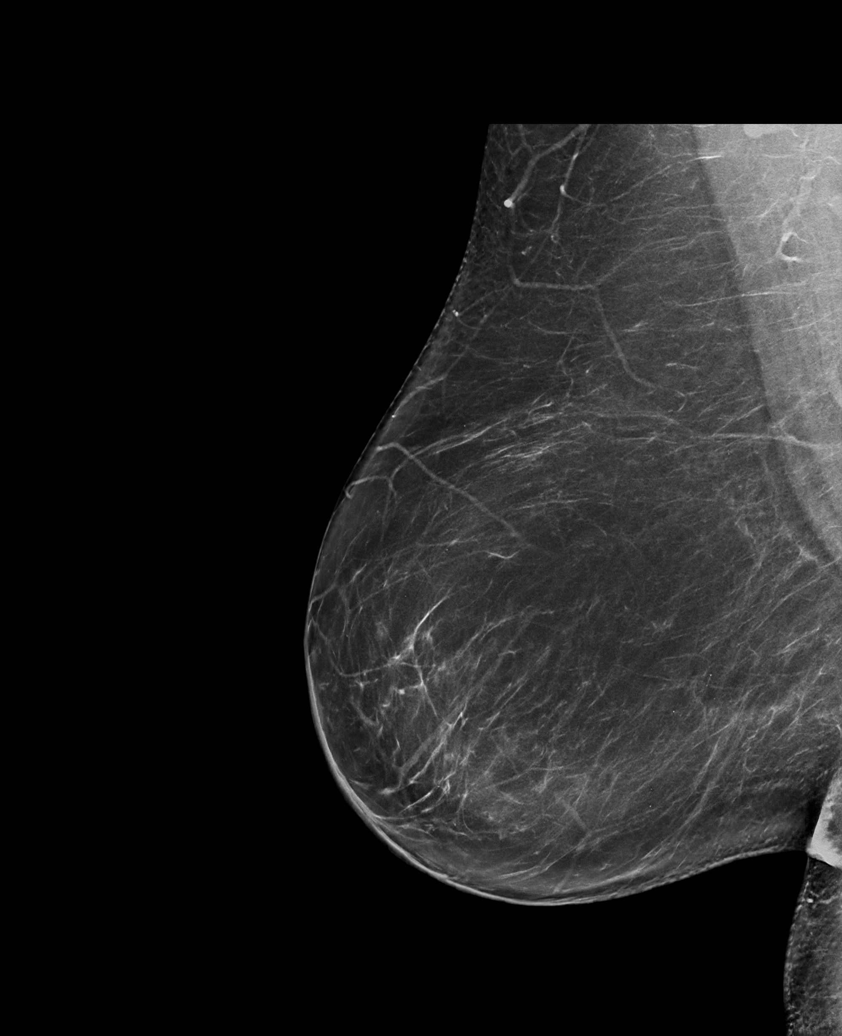

[R CC synth-2D]
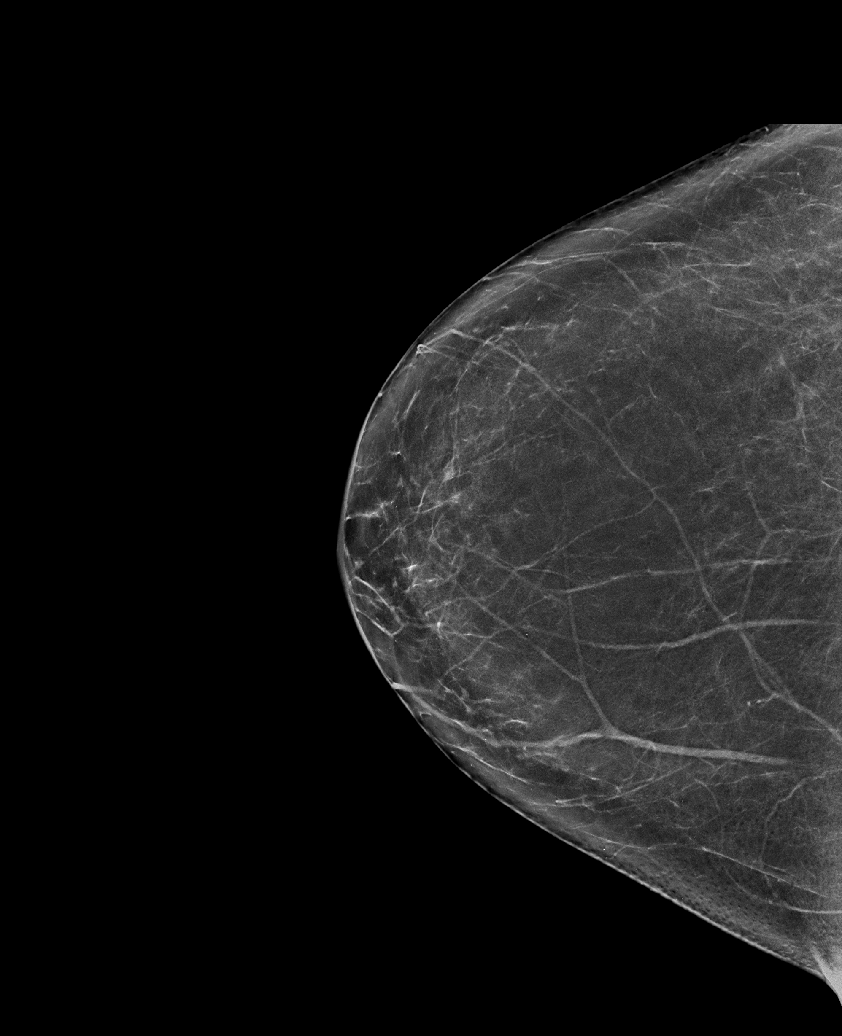

[L CC synth-2D (1 of 2)]
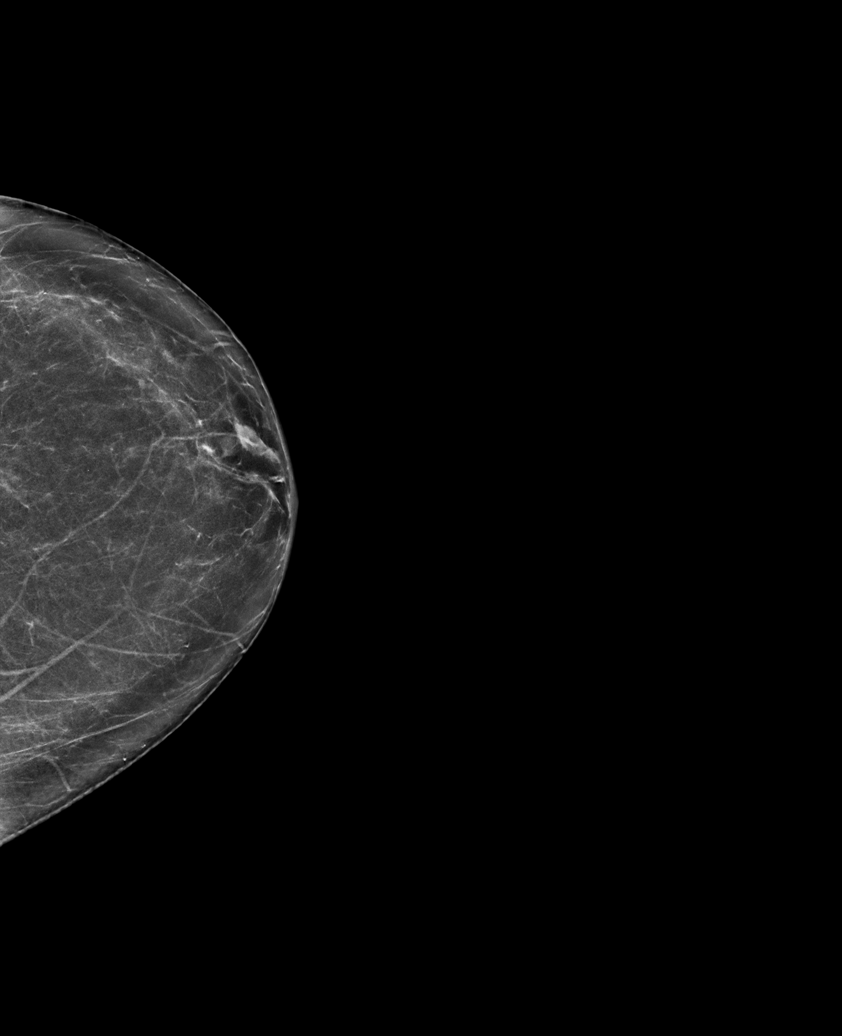

[L MLO synth-2D]
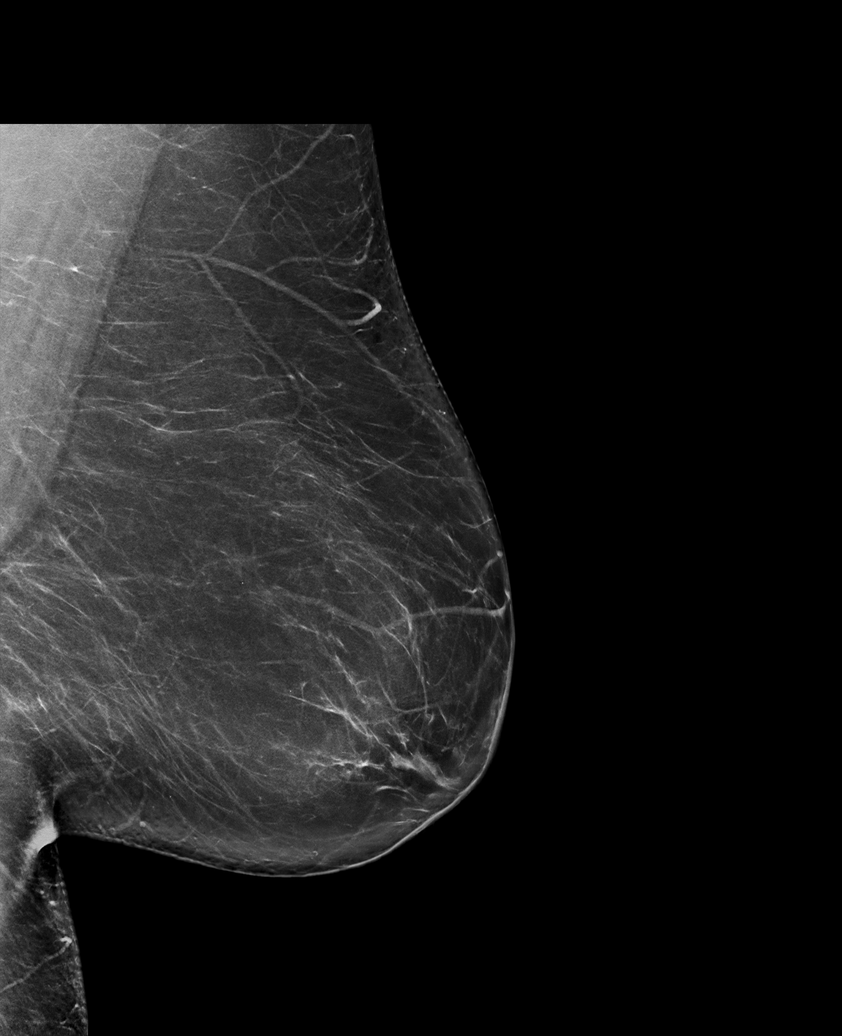

[L CC synth-2D (2 of 2)]
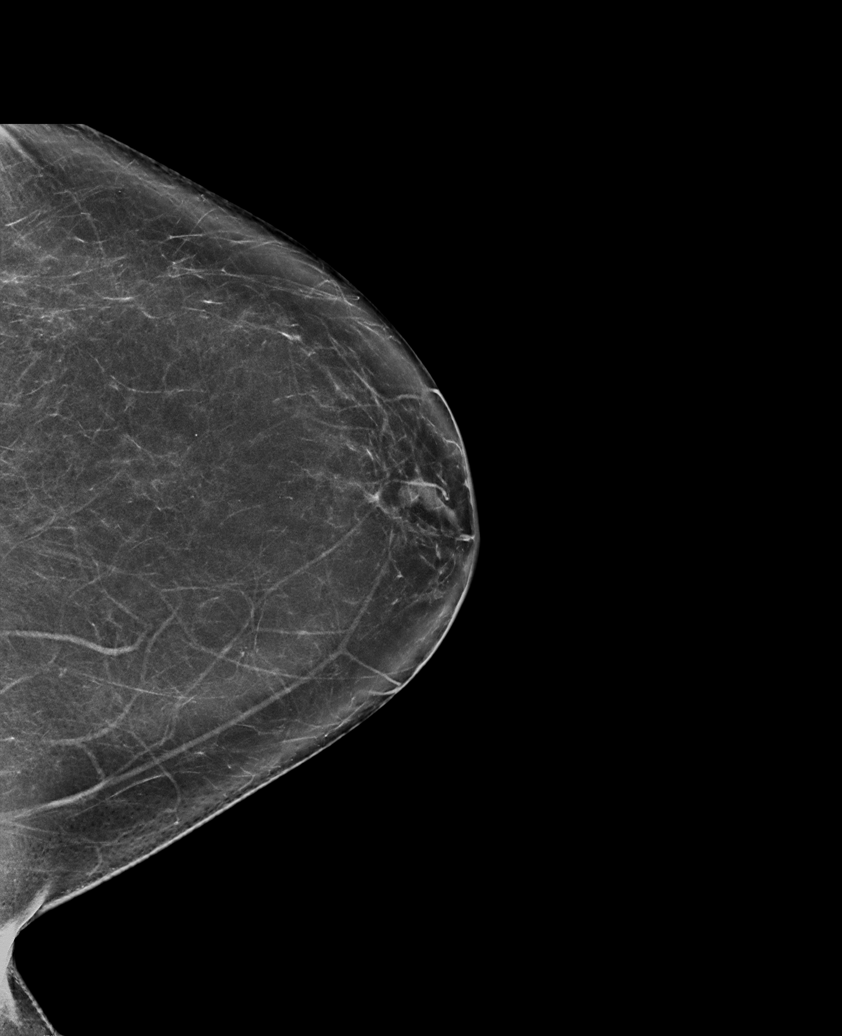

[L CC tomo · tomo slice 39/77.0]
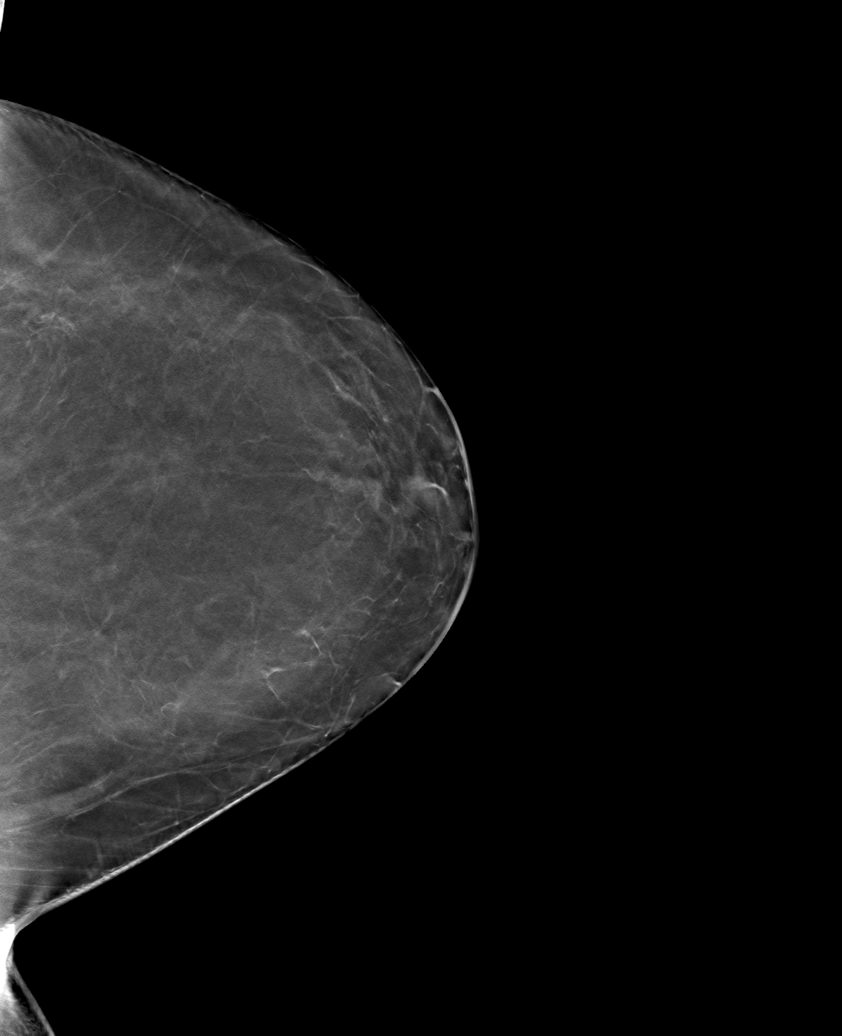

[6 of 30 positions shown; findings below may reference images not displayed]

ACR Breast Density Category b: There are scattered areas of
fibroglandular density.
FINDINGS: There are no findings suspicious for malignancy. Images were
processed with CAD.
IMPRESSION: No mammographic evidence of malignancy. A result letter of this
screening mammogram will be mailed directly to the patient.

RECOMMENDATION:
Screening mammogram in one year. (Code:[id number])

BI-RADS CATEGORY  1: Negative.

## 2020-07-21 DIAGNOSIS — E782 Mixed hyperlipidemia: Secondary | ICD-10-CM | POA: Diagnosis not present

## 2020-07-21 DIAGNOSIS — R7309 Other abnormal glucose: Secondary | ICD-10-CM | POA: Diagnosis not present

## 2020-07-21 DIAGNOSIS — I10 Essential (primary) hypertension: Secondary | ICD-10-CM | POA: Diagnosis not present

## 2020-07-21 DIAGNOSIS — E559 Vitamin D deficiency, unspecified: Secondary | ICD-10-CM | POA: Diagnosis not present

## 2020-07-21 DIAGNOSIS — E063 Autoimmune thyroiditis: Secondary | ICD-10-CM | POA: Diagnosis not present

## 2020-09-23 ENCOUNTER — Other Ambulatory Visit: Payer: Self-pay | Admitting: Internal Medicine

## 2020-09-23 DIAGNOSIS — Z1231 Encounter for screening mammogram for malignant neoplasm of breast: Secondary | ICD-10-CM

## 2020-09-25 ENCOUNTER — Ambulatory Visit
Admission: RE | Admit: 2020-09-25 | Discharge: 2020-09-25 | Disposition: A | Discharge status: Home / Self Care | Payer: BC Managed Care – PPO | Source: Ambulatory Visit | Attending: Internal Medicine | Admitting: Internal Medicine

## 2020-09-25 ENCOUNTER — Other Ambulatory Visit: Payer: Self-pay

## 2020-09-25 DIAGNOSIS — Z1231 Encounter for screening mammogram for malignant neoplasm of breast: Secondary | ICD-10-CM | POA: Diagnosis not present

## 2020-11-27 DIAGNOSIS — Z20822 Contact with and (suspected) exposure to covid-19: Secondary | ICD-10-CM | POA: Diagnosis not present

## 2021-04-20 DIAGNOSIS — Z86010 Personal history of colonic polyps: Secondary | ICD-10-CM | POA: Diagnosis not present

## 2021-04-20 DIAGNOSIS — K219 Gastro-esophageal reflux disease without esophagitis: Secondary | ICD-10-CM | POA: Diagnosis not present

## 2021-04-20 DIAGNOSIS — K9041 Non-celiac gluten sensitivity: Secondary | ICD-10-CM | POA: Diagnosis not present

## 2021-04-20 DIAGNOSIS — Z1211 Encounter for screening for malignant neoplasm of colon: Secondary | ICD-10-CM | POA: Diagnosis not present

## 2021-05-20 ENCOUNTER — Ambulatory Visit (HOSPITAL_COMMUNITY)
Admission: EM | Admit: 2021-05-20 | Discharge: 2021-05-20 | Disposition: A | Discharge status: Home / Self Care | Payer: BC Managed Care – PPO | Attending: Emergency Medicine | Admitting: Emergency Medicine

## 2021-05-20 ENCOUNTER — Other Ambulatory Visit: Payer: Self-pay

## 2021-05-20 ENCOUNTER — Encounter (HOSPITAL_COMMUNITY): Payer: Self-pay | Admitting: *Deleted

## 2021-05-20 DIAGNOSIS — R002 Palpitations: Secondary | ICD-10-CM

## 2021-05-20 LAB — COMPREHENSIVE METABOLIC PANEL
ALT: 21 U/L (ref 0–44)
AST: 19 U/L (ref 15–41)
Albumin: 4.1 g/dL (ref 3.5–5.0)
Alkaline Phosphatase: 89 U/L (ref 38–126)
Anion gap: 8 (ref 5–15)
BUN: 14 mg/dL (ref 6–20)
CO2: 26 mmol/L (ref 22–32)
Calcium: 9.3 mg/dL (ref 8.9–10.3)
Chloride: 106 mmol/L (ref 98–111)
Creatinine, Ser: 0.76 mg/dL (ref 0.44–1.00)
GFR, Estimated: >60 mL/min (ref >60)
Glucose, Bld: 102 mg/dL — ABNORMAL HIGH (ref 70–99)
Potassium: 3.9 mmol/L (ref 3.5–5.1)
Sodium: 140 mmol/L (ref 135–145)
Total Bilirubin: 0.6 mg/dL (ref 0.3–1.2)
Total Protein: 6.7 g/dL (ref 6.5–8.1)

## 2021-05-20 LAB — MAGNESIUM: Magnesium: 2.2 mg/dL (ref 1.7–2.4)

## 2021-05-20 LAB — TSH: TSH: 5.084 u[IU]/mL — ABNORMAL HIGH (ref 0.350–4.500)

## 2021-05-20 NOTE — Discharge Instructions (Addendum)
It has been recommended that you go to the nearest emergency department for further evaluation of your palpitations because I can did not not definitively rule out that it is not your heart that is causing this issue  If you choose not to go to the emergency department, please call your primary care doctor in the morning to be seen as soon as possible, I have also listed information for  heart care onto your paperwork which is a group of cardiologist that you may follow-up with for further evaluation  At any point if you begin to failure palpitations more frequently, your shortness of breath worsens, you begin to have chest pain or tightness please go to the nearest emergency department as soon as possible  Your lab work today is checking her electrolytes, your kidney, your liver, your magnesium level and your thyroid, if any of these levels are concerning you will be notified and told how to move forward

## 2021-05-20 NOTE — ED Triage Notes (Signed)
Pt reports she has felt heart palpitations all afternoon . Pt reports she has mainly just been sitting.

## 2021-05-21 NOTE — ED Provider Notes (Signed)
[place]    CSN: [id number] Arrival date & time: 05/20/21  1756      History   Chief Complaint Chief Complaint  Patient presents with   Palpitations    HPI Jaclyn Murray is a 55 y.o. female.   Patient presents with palpitations beginning today around noon, associated shortness of breath beginning 1-1/2 hours ago and elevated blood pressure readings for 2 days.  Symptoms have not occurred before.  Denies chest pain or tightness, wheezing, dizziness, lightheadedness, blurred vision, abdominal pain, nausea or vomiting.  Denies cardiac history.  Endorses lab work completed by PCP 3 weeks ago all within normal limit.  Taking daily supplements including magnesium.  History of Hashimoto's disease, not currently on medication.   Past Medical History:  Diagnosis Date   Depression    Dyspareunia    "dryness"   Gluten free diet    Hashimoto's disease    per pt dx 2016 approx.  , unable to tolerate oral thyroid medication, followed by pcp   Hypertension    PMB (postmenopausal bleeding)    Submucous uterine fibroid    Wears glasses     There are no problems to display for this patient.   Past Surgical History:  Procedure Laterality Date   COLONOSCOPY WITH PROPOFOL  05/2016   CYSTOSCOPY N/A 02/07/2017   Procedure: CYSTOSCOPY;  Surgeon: Nunzio Cobbs, MD;  Location: [facility];  Service: Gynecology;  Laterality: N/A;   DILATATION & CURETTAGE/HYSTEROSCOPY WITH MYOSURE N/A 02/07/2017   Procedure: DILATATION & CURETTAGE/HYSTEROSCOPY WITH   RESECTOSCOPE OF SUBMUCOSAL FIBROID, SUTURING OF CERVICAL LACERATION;  Surgeon: Nunzio Cobbs, MD;  Location: [facility];  Service: Gynecology;  Laterality: N/A;   TONSILLECTOMY AND ADENOIDECTOMY  age 77    OB History     Gravida  0   Para      Term      Preterm      AB      Living         SAB      IAB      Ectopic      Multiple      Live Births                Home Medications    Prior to Admission medications   Medication Sig Start Date End Date Taking? Authorizing Provider  B Complex-C-E-Zn (B COMPLEX-C-E-ZINC) tablet Take 1 tablet by mouth daily.    [provider]  betamethasone valerate ointment (VALISONE) 0.1 % Apply 1 application topically 2 (two) times daily. Use for 2 weeks at a time as needed. 04/11/18   Nunzio Cobbs, MD  Cholecalciferol (VITAMIN D) 2000 UNITS tablet Take 2,000 Units by mouth 2 (two) times daily.    [provider]  Magnesium 300 MG CAPS Take by mouth daily.    [provider]  Multiple Vitamins-Minerals (MULTIVITAMIN PO) Take by mouth.    [provider]  naproxen (NAPROSYN) 500 MG tablet Take 500 mg by mouth as needed.    [provider]  nystatin-triamcinolone ointment (MYCOLOG) Apply 1 application topically 2 (two) times daily. Use as need for one week at a time. 06/05/17   Nunzio Cobbs, MD  Omega-3 Fatty Acids (FISH OIL) 1000 MG CAPS Take 2,000 mg by mouth daily.    [provider]  Probiotic Product (PROBIOTIC ADVANCED PO) UltraFlora Balance  Take 1 capsule daily  [provider]  S-Adenosylmethionine (SAM-E COMPLETE PO) Take 1 tablet by mouth daily.    [provider]  TURMERIC PO Take by mouth daily.    [provider]    Family History Family History  Problem Relation Age of Onset   Dementia Mother    Hypertension Mother    Parkinson's disease Father    Diabetes Sister    Hypertension Sister    Thyroid disease Sister    Diabetes Sister    Hypertension Sister    Cancer Maternal Grandmother 77       colon ca   Stroke Maternal Grandmother    Thyroid disease Maternal Grandmother    Cancer Paternal Grandfather 68       colon ca   Hypertension Maternal Grandfather    Breast cancer Neg Hx     Social History Social History   Tobacco Use   Smoking status: Never   Smokeless tobacco:  Never  Vaping Use   Vaping Use: Never used  Substance Use Topics   Alcohol use: Yes    Alcohol/week: 1.0 standard drink    Types: 1 Standard drinks or equivalent per week    Comment: 1 drink per month   Drug use: No     Allergies   Ace inhibitors, Ciprofloxacin, Corn-containing products, Wheat bran, and Erythromycin   Review of Systems Review of Systems  Constitutional: Negative.   HENT: Negative.    Respiratory:  Positive for shortness of breath. Negative for apnea, cough, choking, chest tightness, wheezing and stridor.   Cardiovascular:  Positive for palpitations. Negative for chest pain and leg swelling.  Skin: Negative.   Neurological: Negative.     Physical Exam Triage Vital Signs ED Triage Vitals  Enc Vitals Group     BP 05/20/21 1810 (!) 170/102     Pulse Rate 05/20/21 1810 77     Resp 05/20/21 1810 20     Temp 05/20/21 1810 98.8 F (37.1 C)     Temp src --      SpO2 05/20/21 1810 98 %     Weight --      Height --      Head Circumference --      Peak Flow --      Pain Score 05/20/21 1811 0     Pain Loc --      Pain Edu? --      Excl. in [place]? --    No data found.  Updated Vital Signs BP (!) 170/102   Pulse 77   Temp 98.8 F (37.1 C)   Resp 20   LMP 08/23/2014 (Exact Date)   SpO2 98%   Visual Acuity Right Eye Distance:   Left Eye Distance:   Bilateral Distance:    Right Eye Near:   Left Eye Near:    Bilateral Near:     Physical Exam Constitutional:      Appearance: Normal appearance. She is normal weight.  Eyes:     Extraocular Movements: Extraocular movements intact.  Cardiovascular:     Rate and Rhythm: Normal rate and regular rhythm.     Pulses: Normal pulses.     Heart sounds: Normal heart sounds.  Pulmonary:     Effort: Pulmonary effort is normal.     Breath sounds: Normal breath sounds.  Skin:    General: Skin is warm and dry.  Neurological:     General: No focal deficit present.     Mental Status: She is alert and  oriented  to person, place, and time. Mental status is at baseline.  Psychiatric:        Mood and Affect: Mood normal.        Behavior: Behavior normal.     UC Treatments / Results  Labs (all labs ordered are listed, but only abnormal results are displayed) Labs Reviewed  COMPREHENSIVE METABOLIC PANEL - Abnormal; Notable for the following components:      Result Value   Glucose, Bld 102 (*)    All other components within normal limits  TSH - Abnormal; Notable for the following components:   TSH 5.084 (*)    All other components within normal limits  MAGNESIUM    EKG   Radiology No results found.  Procedures Procedures (including critical care time)  Medications Ordered in UC Medications - No data to display  Initial Impression / Assessment and Plan / UC Course  I have reviewed the triage vital signs and the nursing notes.  Pertinent labs & imaging results that were available during my care of the patient were reviewed by me and considered in my medical decision making (see chart for details).  Palpitations  EKG showing normal sinus rhythm, reviewed by this provider and MD in house, discussed with patient even though change in cardiac rhythm not captured on EKG this does not fully rule out cardiac involvement, and with recent lab work being within normal limits per patient, unable to review in chart, it was recommended that patient go to nearest emergency department for full cardiac work-up, patient declined, will reassess lab work at this time and notify patient of concerning values, patient instructed that if she does not go to the emergency department to contact primary care doctor in the morning for follow-up, patient noted PCP is currently out of town, given walking referral to heart care cardiology practice, given strict return precautions to go to nearest emergency department if palpitations increase in frequency or intensity or associated symptoms worsen, patient in agreement  with plan of care Final Clinical Impressions(s) / UC Diagnoses   Final diagnoses:  Palpitations     Discharge Instructions      It has been recommended that you go to the nearest emergency department for further evaluation of your palpitations because I can did not not definitively rule out that it is not your heart that is causing this issue  If you choose not to go to the emergency department, please call your primary care doctor in the morning to be seen as soon as possible, I have also listed information for  heart care onto your paperwork which is a group of cardiologist that you may follow-up with for further evaluation  At any point if you begin to failure palpitations more frequently, your shortness of breath worsens, you begin to have chest pain or tightness please go to the nearest emergency department as soon as possible  Your lab work today is checking her electrolytes, your kidney, your liver, your magnesium level and your thyroid, if any of these levels are concerning you will be notified and told how to move forward   ED Prescriptions   None    PDMP not reviewed this encounter.   Hans Eden, NP 05/21/21 1006

## 2021-05-27 DIAGNOSIS — E063 Autoimmune thyroiditis: Secondary | ICD-10-CM | POA: Diagnosis not present

## 2021-05-27 DIAGNOSIS — R002 Palpitations: Secondary | ICD-10-CM | POA: Diagnosis not present

## 2021-05-27 DIAGNOSIS — M25511 Pain in right shoulder: Secondary | ICD-10-CM | POA: Diagnosis not present

## 2021-06-09 NOTE — Progress Notes (Signed)
Patient referred by Willey Blade, MD for palpitations  Subjective:   Jaclyn Murray, female    DOB: 01-Jul-1966, 55 y.o.   MRN: [id number]   Chief Complaint  Patient presents with   Palpitations   New Patient (Initial Visit)    Referred by Willey Blade, MD     HPI  55 y.o. Caucasian female with hypertension, Hashimoto thyroiditis, hypothyroidism, referred for evaluation of palpitations.  Patient is semiretired, stays active with household work and walking.  On October 13, she experienced irregular heartbeat and palpitations followed by shortness of breath.  She went to urgent care and then was seen by her PCP.  Work-up was reportedly normal.  She was started on propranolol 20 mg twice daily.  However, given her heart rate in the 50s, this was reduced to 10 mg twice daily.  Patient continued to have symptoms on a daily basis for about 2 weeks.  However, she has not had any recurrent symptoms in the last week or so.  She denies any chest pain.  Patient wears apple watch.  I reviewed her apple watch tracings, including on the day that she had her symptoms on 05/20/2021.  Tracings show PVCs, heart did not show any A. fib. (Media attached)  She reportedly does not tolerate levothyroxine. Her TSH runs around 5.    Past Medical History:  Diagnosis Date   Depression    Dyspareunia    "dryness"   Gluten free diet    Hashimoto's disease    per pt dx 2016 approx.  , unable to tolerate oral thyroid medication, followed by pcp   Hypertension    PMB (postmenopausal bleeding)    Submucous uterine fibroid    Wears glasses      Past Surgical History:  Procedure Laterality Date   COLONOSCOPY WITH PROPOFOL  05/2016   CYSTOSCOPY N/A 02/07/2017   Procedure: CYSTOSCOPY;  Surgeon: Nunzio Cobbs, MD;  Location: [facility];  Service: Gynecology;  Laterality: N/A;   DILATATION & CURETTAGE/HYSTEROSCOPY WITH MYOSURE N/A 02/07/2017   Procedure: DILATATION  & CURETTAGE/HYSTEROSCOPY WITH   RESECTOSCOPE OF SUBMUCOSAL FIBROID, SUTURING OF CERVICAL LACERATION;  Surgeon: Nunzio Cobbs, MD;  Location: [facility];  Service: Gynecology;  Laterality: N/A;   TONSILLECTOMY AND ADENOIDECTOMY  age 68     Social History   Tobacco Use  Smoking Status Never  Smokeless Tobacco Never    Social History   Substance and Sexual Activity  Alcohol Use Yes   Alcohol/week: 1.0 standard drink   Types: 1 Standard drinks or equivalent per week   Comment: 1 drink per month     Family History  Problem Relation Age of Onset   Dementia Mother    Hypertension Mother    Parkinson's disease Father    Diabetes Sister    Hypertension Sister    Thyroid disease Sister    Diabetes Sister    Hypertension Sister    Cancer Maternal Grandmother 101       colon ca   Stroke Maternal Grandmother    Thyroid disease Maternal Grandmother    Cancer Paternal Grandfather 68       colon ca   Hypertension Maternal Grandfather    Breast cancer Neg Hx      Current Outpatient Medications on File Prior to Visit  Medication Sig Dispense Refill   B Complex-C-E-Zn (B COMPLEX-C-E-ZINC) tablet Take 1 tablet by mouth daily.     betamethasone valerate  ointment (VALISONE) 0.1 % Apply 1 application topically 2 (two) times daily. Use for 2 weeks at a time as needed. 30 g 0   Cholecalciferol (VITAMIN D) 2000 UNITS tablet Take 2,000 Units by mouth 2 (two) times daily.     Magnesium 300 MG CAPS Take by mouth daily.     Multiple Vitamins-Minerals (MULTIVITAMIN PO) Take by mouth.     naproxen (NAPROSYN) 500 MG tablet Take 500 mg by mouth as needed.     nystatin-triamcinolone ointment (MYCOLOG) Apply 1 application topically 2 (two) times daily. Use as need for one week at a time. 30 g 0   Omega-3 Fatty Acids (FISH OIL) 1000 MG CAPS Take 2,000 mg by mouth daily.     Probiotic Product (PROBIOTIC ADVANCED PO) UltraFlora Balance  Take 1 capsule daily      S-Adenosylmethionine (SAM-E COMPLETE PO) Take 1 tablet by mouth daily.     TURMERIC PO Take by mouth daily.     No current facility-administered medications on file prior to visit.    Cardiovascular and other pertinent studies:  EKG 06/10/2021: Sinus rhythm 57 bpm Normal EKG  EKG 05/27/2021: Sinus rhythm 69 bpm.  Left axis deviation.    Recent labs: Not available   Review of Systems  Cardiovascular:  Positive for palpitations. Negative for chest pain, dyspnea on exertion, leg swelling and syncope.  Respiratory:  Positive for shortness of breath.         Vitals:   06/10/21 0932  BP: (!) 163/82  Pulse: (!) 58  Resp: 14  Temp: 98.1 F (36.7 C)  SpO2: 97%     Body mass index is 32.48 kg/m. Filed Weights   06/10/21 0932  Weight: 189 lb 3.2 oz (85.8 kg)     Objective:   Physical Exam Vitals and nursing note reviewed.  Constitutional:      General: She is not in acute distress. Neck:     Vascular: No JVD.  Cardiovascular:     Rate and Rhythm: Normal rate and regular rhythm.     Heart sounds: Normal heart sounds. No murmur heard. Pulmonary:     Effort: Pulmonary effort is normal.     Breath sounds: Normal breath sounds. No wheezing or rales.  Musculoskeletal:     Right lower leg: No edema.     Left lower leg: No edema.         Assessment & Recommendations:   55 y.o. Caucasian female with hypertension, Hashimoto thyroiditis, hypothyroidism, referred for evaluation of palpitations.  Palpitations: Most likely due to symptomatic PVCs.  Enrolled in cardio logs monitoring of her Apple Watch tracings.  Encouraged her to notify should she have any more frequent symptoms, and especially if they get flagged as "irregular rhythm", or "atrial fibrillation" by her apple watch. In addition, we will perform echocardiogram and exercise treadmill stress test.  With recommendations after above testing.  Thank you for referring the patient to [place]. Please feel free to  contact with any questions.   Nigel Mormon, MD Pager: [phone] Office: [phone]

## 2021-06-10 ENCOUNTER — Encounter: Payer: Self-pay | Admitting: Cardiology

## 2021-06-10 ENCOUNTER — Other Ambulatory Visit: Payer: Self-pay

## 2021-06-10 ENCOUNTER — Ambulatory Visit: Payer: BC Managed Care – PPO | Admitting: Cardiology

## 2021-06-10 VITALS — BP 163/82 | HR 58 | Temp 98.1°F | Resp 14 | Ht 64.0 in | Wt 189.2 lb

## 2021-06-10 DIAGNOSIS — I493 Ventricular premature depolarization: Secondary | ICD-10-CM | POA: Insufficient documentation

## 2021-06-10 DIAGNOSIS — R002 Palpitations: Secondary | ICD-10-CM | POA: Diagnosis not present

## 2021-06-10 HISTORY — DX: Ventricular premature depolarization: I49.3

## 2021-06-14 ENCOUNTER — Ambulatory Visit: Payer: BC Managed Care – PPO

## 2021-06-14 ENCOUNTER — Other Ambulatory Visit: Payer: Self-pay

## 2021-06-14 DIAGNOSIS — I493 Ventricular premature depolarization: Secondary | ICD-10-CM

## 2021-06-14 DIAGNOSIS — R002 Palpitations: Secondary | ICD-10-CM | POA: Diagnosis not present

## 2021-06-15 NOTE — Telephone Encounter (Signed)
From pt

## 2021-06-28 ENCOUNTER — Encounter: Payer: Self-pay | Admitting: Cardiology

## 2021-06-28 NOTE — Telephone Encounter (Signed)
From pt

## 2021-06-28 NOTE — Telephone Encounter (Signed)
From patient.

## 2021-06-28 NOTE — Telephone Encounter (Signed)
Pleas change propranolol to metoprolol tartrate 12.5 mg bid. Please send 60 pillsX1 refill  Thanks MJP

## 2021-06-29 ENCOUNTER — Other Ambulatory Visit: Payer: Self-pay

## 2021-06-29 MED ORDER — METOPROLOL TARTRATE 25 MG PO TABS: 12.5000 mg | ORAL_TABLET | Freq: Two times a day (BID) | ORAL | 1 refills | Status: DC

## 2021-07-12 DIAGNOSIS — R7309 Other abnormal glucose: Secondary | ICD-10-CM | POA: Diagnosis not present

## 2021-07-12 DIAGNOSIS — E559 Vitamin D deficiency, unspecified: Secondary | ICD-10-CM | POA: Diagnosis not present

## 2021-07-12 DIAGNOSIS — I10 Essential (primary) hypertension: Secondary | ICD-10-CM | POA: Diagnosis not present

## 2021-07-12 DIAGNOSIS — E063 Autoimmune thyroiditis: Secondary | ICD-10-CM | POA: Diagnosis not present

## 2021-07-12 DIAGNOSIS — E782 Mixed hyperlipidemia: Secondary | ICD-10-CM | POA: Diagnosis not present

## 2021-07-27 DIAGNOSIS — Z6832 Body mass index (BMI) 32.0-32.9, adult: Secondary | ICD-10-CM | POA: Diagnosis not present

## 2021-07-27 DIAGNOSIS — Z0001 Encounter for general adult medical examination with abnormal findings: Secondary | ICD-10-CM | POA: Diagnosis not present

## 2021-07-27 DIAGNOSIS — E063 Autoimmune thyroiditis: Secondary | ICD-10-CM | POA: Diagnosis not present

## 2021-07-27 DIAGNOSIS — R5383 Other fatigue: Secondary | ICD-10-CM | POA: Diagnosis not present

## 2021-07-27 DIAGNOSIS — R002 Palpitations: Secondary | ICD-10-CM | POA: Diagnosis not present

## 2021-08-26 ENCOUNTER — Other Ambulatory Visit: Payer: Self-pay | Admitting: Internal Medicine

## 2021-08-26 DIAGNOSIS — Z1231 Encounter for screening mammogram for malignant neoplasm of breast: Secondary | ICD-10-CM

## 2021-09-10 ENCOUNTER — Ambulatory Visit: Payer: BC Managed Care – PPO | Admitting: Student

## 2021-09-10 ENCOUNTER — Other Ambulatory Visit: Payer: Self-pay

## 2021-09-10 ENCOUNTER — Ambulatory Visit: Payer: BC Managed Care – PPO | Admitting: Cardiology

## 2021-09-10 ENCOUNTER — Encounter: Payer: Self-pay | Admitting: Student

## 2021-09-10 VITALS — BP 150/90 | HR 58 | Temp 98.0°F | Ht 64.0 in | Wt 196.0 lb

## 2021-09-10 DIAGNOSIS — R002 Palpitations: Secondary | ICD-10-CM

## 2021-09-10 DIAGNOSIS — I493 Ventricular premature depolarization: Secondary | ICD-10-CM

## 2021-09-10 DIAGNOSIS — I10 Essential (primary) hypertension: Secondary | ICD-10-CM | POA: Diagnosis not present

## 2021-09-10 NOTE — Progress Notes (Signed)
Patient referred by Willey Blade, MD for palpitations  Subjective:   Jaclyn Murray, female    DOB: November 29, 1965, 56 y.o.   MRN: [id number]   Chief Complaint  Patient presents with   pvc's   Follow-up     HPI  56 y.o. Caucasian female with hypertension, Hashimoto thyroiditis, hypothyroidism, referred for evaluation of palpitations.  Patient presents for follow-up of palpitations and results of cardiac testing.  Patient underwent exercise stress test which was overall low risk.  Echocardiogram revealed preserved LVEF with grade 1 diastolic dysfunction and no significant valvular abnormality.  Cardio logs monitoring revealed PVCs, therefore patient was switched from propranolol to Lopressor, which she is tolerating without issue.  Patient states that she had several weeks of no palpitations, however she has been on prednisone since early December and has gained about 10 pounds.  In the last week patient has noticed episodes of palpitations occurring every other day lasting for few minutes without other associated symptoms.  Patient is in the process of tapering off of prednisone at this time.  She continues to follow closely with PCP for management of thyroid disease and hypertension as well. She reportedly does not tolerate levothyroxine. Her TSH runs around 5.   Past Medical History:  Diagnosis Date   Depression    Dyspareunia    "dryness"   Gluten free diet    Hashimoto's disease    per pt dx 2016 approx.  , unable to tolerate oral thyroid medication, followed by pcp   Hypertension    PMB (postmenopausal bleeding)    Submucous uterine fibroid    Wears glasses    Past Surgical History:  Procedure Laterality Date   COLONOSCOPY WITH PROPOFOL  05/2016   CYSTOSCOPY N/A 02/07/2017   Procedure: CYSTOSCOPY;  Surgeon: Nunzio Cobbs, MD;  Location: [facility];  Service: Gynecology;  Laterality: N/A;   DILATATION & CURETTAGE/HYSTEROSCOPY WITH  MYOSURE N/A 02/07/2017   Procedure: DILATATION & CURETTAGE/HYSTEROSCOPY WITH   RESECTOSCOPE OF SUBMUCOSAL FIBROID, SUTURING OF CERVICAL LACERATION;  Surgeon: Nunzio Cobbs, MD;  Location: [facility];  Service: Gynecology;  Laterality: N/A;   TONSILLECTOMY AND ADENOIDECTOMY  age 56   Social History   Tobacco Use  Smoking Status Never  Smokeless Tobacco Never   Social History   Substance and Sexual Activity  Alcohol Use Yes   Alcohol/week: 1.0 standard drink   Types: 1 Standard drinks or equivalent per week   Comment: 1 drink per month   Family History  Problem Relation Age of Onset   Dementia Mother    Hypertension Mother    Parkinson's disease Father    Diabetes Sister    Hypertension Sister    Thyroid disease Sister    Diabetes Sister    Hypertension Sister    Cancer Maternal Grandmother 41       colon ca   Stroke Maternal Grandmother    Thyroid disease Maternal Grandmother    Cancer Paternal Grandfather 68       colon ca   Hypertension Maternal Grandfather    Breast cancer Neg Hx    Current Outpatient Medications on File Prior to Visit  Medication Sig Dispense Refill   Cholecalciferol (VITAMIN D) 2000 UNITS tablet Take 2,000 Units by mouth 2 (two) times daily.     famotidine (PEPCID) 20 MG tablet Take 20 mg by mouth 2 (two) times daily.     hydrochlorothiazide (MICROZIDE) 12.5 MG capsule Take  12.5-25 mg by mouth as needed.     losartan (COZAAR) 100 MG tablet Take 100 mg by mouth daily.     Magnesium 300 MG CAPS Take by mouth daily.     Melatonin 5 MG CAPS Take 5 mg by mouth daily.     metoprolol tartrate (LOPRESSOR) 25 MG tablet Take 0.5 tablets (12.5 mg total) by mouth 2 (two) times daily. 60 tablet 1   NON FORMULARY Take 1 capsule by mouth daily. neurocalm     NON FORMULARY Take 1 capsule by mouth daily. serencalm     predniSONE (DELTASONE) 10 MG tablet Take 5 mg by mouth daily.     predniSONE (DELTASONE) 5 MG tablet Take 5 mg by mouth  daily.     S-Adenosylmethionine (SAM-E COMPLETE PO) Take 1 tablet by mouth daily.     SYNTHROID 25 MCG tablet Take 25 mcg by mouth every morning.     No current facility-administered medications on file prior to visit.    Cardiovascular and other pertinent studies: PCV CARDIAC STRESS TEST 06/25/2021 Exercise treadmill stress test performed using Bruce protocol.  Patient reached 7.3 METS, and 90% of age predicted maximum heart rate.  Exercise capacity was low normal.  No chest pain reported.  Dyspnea reported.  Normal heart rate response. Resting and exercise hypertension. Peak BP 220/100 mmHg. Stress EKG revealed no ischemic changes. No arrythmia noted. Low risk study.  PCV ECHOCARDIOGRAM COMPLETE 06/25/21 Left ventricle cavity is normal in size and wall thickness. Normal global wall motion. Normal LV systolic function with visual EF 67%. Doppler evidence of grade I (impaired) diastolic dysfunction, normal LAP. No significant valvular abnormality. No evidence of pulmonary hypertension.   EKG 06/10/2021: Sinus rhythm 57 bpm Normal EKG  EKG 05/27/2021: Sinus rhythm 69 bpm.  Left axis deviation.    Recent labs: 05/20/2021: TSH 5.08 (elevated) Magnesium 2.2 Sodium 140, potassium 3.9, BUN 14, creatinine 0.76, GFR >60   Review of Systems  Cardiovascular:  Positive for palpitations. Negative for chest pain, dyspnea on exertion, leg swelling and syncope.  Respiratory:  Negative for shortness of breath.         Vitals:   09/10/21 1054 09/10/21 1105  BP: (!) 179/87 (!) 150/90  Pulse: (!) 53 (!) 58  Temp: 98 F (36.7 C)   SpO2: 99%      Body mass index is 33.64 kg/m. Filed Weights   09/10/21 1054  Weight: 196 lb (88.9 kg)     Objective:   Physical Exam Vitals reviewed.  Constitutional:      General: She is not in acute distress. Neck:     Vascular: No JVD.  Cardiovascular:     Rate and Rhythm: Normal rate and regular rhythm.     Heart sounds: Normal heart  sounds. No murmur heard. Pulmonary:     Effort: Pulmonary effort is normal.     Breath sounds: Normal breath sounds.  Musculoskeletal:     Right lower leg: No edema.     Left lower leg: No edema.         Assessment & Recommendations:  56 y.o. Caucasian female with hypertension, Hashimoto thyroiditis, hypothyroidism, referred for evaluation of palpitations.  PVCs/Palpitations: Palpitations have significantly improved with initiation of Lopressor, however she has been on prednisone and gained weight recently and over the last week has noticed increased palpitations.  Suspect this is related to recent weight gain. Will not make changes at this time, rather encourage patient to continue to monitor symptoms and will  reevaluate at next office visit if additional medications or medication changes are needed. Encourage patient to focus on making diet and lifestyle modifications, particularly regarding weight loss.  Hypertension: Blood pressure remains uncontrolled Continue losartan and metoprolol She has previously been on HCTZ daily, however PCP discontinued this due to hypotension and hypokalemia.  Patient has close follow-up scheduled with PCP for repeat evaluation of blood pressure management, will defer to PCP at this time.  Reviewed and discussed results of echocardiogram and stress test, details above.  Patient's questions were addressed accordingly.  Echocardiogram revealed normal LVEF and grade 1 diastolic dysfunction.  Stress test was overall low risk.  Follow-up in 6 months, sooner if needed.   Alethia Berthold, PA-C 09/10/2021, 11:25 AM Office: [phone]

## 2021-09-13 DIAGNOSIS — K635 Polyp of colon: Secondary | ICD-10-CM | POA: Diagnosis not present

## 2021-09-13 DIAGNOSIS — D120 Benign neoplasm of cecum: Secondary | ICD-10-CM | POA: Diagnosis not present

## 2021-09-13 DIAGNOSIS — Z1211 Encounter for screening for malignant neoplasm of colon: Secondary | ICD-10-CM | POA: Diagnosis not present

## 2021-09-13 DIAGNOSIS — K6389 Other specified diseases of intestine: Secondary | ICD-10-CM | POA: Diagnosis not present

## 2021-09-27 DIAGNOSIS — Z1231 Encounter for screening mammogram for malignant neoplasm of breast: Secondary | ICD-10-CM

## 2021-10-20 ENCOUNTER — Other Ambulatory Visit: Payer: Self-pay | Admitting: Cardiology

## 2021-10-25 DIAGNOSIS — I10 Essential (primary) hypertension: Secondary | ICD-10-CM | POA: Diagnosis not present

## 2021-10-25 DIAGNOSIS — J069 Acute upper respiratory infection, unspecified: Secondary | ICD-10-CM | POA: Diagnosis not present

## 2021-10-25 DIAGNOSIS — R5383 Other fatigue: Secondary | ICD-10-CM | POA: Diagnosis not present

## 2021-12-02 DIAGNOSIS — E063 Autoimmune thyroiditis: Secondary | ICD-10-CM | POA: Diagnosis not present

## 2022-01-04 DIAGNOSIS — I10 Essential (primary) hypertension: Secondary | ICD-10-CM | POA: Diagnosis not present

## 2022-01-04 DIAGNOSIS — E063 Autoimmune thyroiditis: Secondary | ICD-10-CM | POA: Diagnosis not present

## 2022-01-04 DIAGNOSIS — R002 Palpitations: Secondary | ICD-10-CM | POA: Diagnosis not present

## 2022-01-04 DIAGNOSIS — M7660 Achilles tendinitis, unspecified leg: Secondary | ICD-10-CM | POA: Diagnosis not present

## 2022-02-15 ENCOUNTER — Other Ambulatory Visit: Payer: Self-pay | Admitting: Cardiology

## 2022-02-16 DIAGNOSIS — L0291 Cutaneous abscess, unspecified: Secondary | ICD-10-CM | POA: Diagnosis not present

## 2022-02-16 DIAGNOSIS — R002 Palpitations: Secondary | ICD-10-CM | POA: Diagnosis not present

## 2022-03-10 ENCOUNTER — Inpatient Hospital Stay: Payer: BC Managed Care – PPO

## 2022-03-10 ENCOUNTER — Ambulatory Visit: Payer: BC Managed Care – PPO | Admitting: Cardiology

## 2022-03-10 ENCOUNTER — Encounter: Payer: Self-pay | Admitting: Cardiology

## 2022-03-10 VITALS — BP 124/75 | HR 63 | Temp 98.5°F | Resp 16 | Ht 64.0 in | Wt 194.8 lb

## 2022-03-10 DIAGNOSIS — I493 Ventricular premature depolarization: Secondary | ICD-10-CM | POA: Diagnosis not present

## 2022-03-10 DIAGNOSIS — I10 Essential (primary) hypertension: Secondary | ICD-10-CM

## 2022-03-10 MED ORDER — METOPROLOL TARTRATE 25 MG PO TABS: 25.0000 mg | ORAL_TABLET | Freq: Two times a day (BID) | ORAL | 3 refills | Status: DC

## 2022-03-10 NOTE — Progress Notes (Signed)
*/   Patient referred by Willey Blade, MD for palpitations  Subjective:   Jaclyn Murray, female    DOB: 28-Oct-1965, 55 y.o.   MRN: [id number]   Chief Complaint  Patient presents with   PVC   Follow-up    6 month     HPI  56 y.o. Caucasian female with hypertension, Hashimoto thyroiditis, hypothyroidism, PVCs   Patient continues to have shortness of breath and palpitation symptoms.  Blood pressure is better controlled.  She is currently on metoprolol 12.5 daily.   Current Outpatient Medications:    Cholecalciferol (VITAMIN D) 125 MCG (5000 UT) CAPS, Take 2,000 Units by mouth 2 (two) times daily., Disp: , Rfl:    hydrochlorothiazide (MICROZIDE) 12.5 MG capsule, Take 12.5-25 mg by mouth as needed., Disp: , Rfl:    losartan (COZAAR) 100 MG tablet, Take 100 mg by mouth daily., Disp: , Rfl:    Magnesium 300 MG CAPS, Take by mouth daily., Disp: , Rfl:    Melatonin 5 MG CAPS, Take 5 mg by mouth daily., Disp: , Rfl:    metoprolol tartrate (LOPRESSOR) 25 MG tablet, Take 1/2 TABLET (12.5 mg total) by mouth 2 (two) times daily., Disp: 60 tablet, Rfl: 1   NON FORMULARY, Take 1 capsule by mouth daily. neurocalm, Disp: , Rfl:    NON FORMULARY, , Disp: , Rfl:    NON FORMULARY, Take 1 tablet by mouth daily. thyrosol, Disp: , Rfl:    S-Adenosylmethionine (SAM-E COMPLETE PO), Take 1 tablet by mouth daily., Disp: , Rfl:    SYNTHROID 25 MCG tablet, Take 25 mcg by mouth every morning., Disp: , Rfl:   Cardiovascular and other pertinent studies:  EKG 03/10/2022: Sinus rhyhtm 59 bpm Low voltage in precordial leads   PCV CARDIAC STRESS TEST 06/14/2021 Exercise treadmill stress test performed using Bruce protocol.  Patient reached 7.3 METS, and 90% of age predicted maximum heart rate.  Exercise capacity was low normal.  No chest pain reported.  Dyspnea reported.  Normal heart rate response. Resting and exercise hypertension. Peak BP 220/100 mmHg. Stress EKG revealed no ischemic changes. No  arrythmia noted. Low risk study.  PCV ECHOCARDIOGRAM COMPLETE 06/14/2021 Left ventricle cavity is normal in size and wall thickness. Normal global wall motion. Normal LV systolic function with visual EF 67%. Doppler evidence of grade I (impaired) diastolic dysfunction, normal LAP. No significant valvular abnormality. No evidence of pulmonary hypertension.   Recent labs: 05/20/2021: TSH 5.08 (elevated) Magnesium 2.2 Sodium 140, potassium 3.9, BUN 14, creatinine 0.76, GFR >60   Review of Systems  Cardiovascular:  Positive for dyspnea on exertion and palpitations. Negative for chest pain, leg swelling and syncope.  Respiratory:  Negative for shortness of breath.          Vitals:   03/10/22 0924  BP: 124/75  Pulse: 63  Resp: 16  Temp: 98.5 F (36.9 C)  SpO2: 97%     Body mass index is 33.44 kg/m. Filed Weights   03/10/22 0924  Weight: 194 lb 12.8 oz (88.4 kg)     Objective:   Physical Exam Vitals reviewed.  Constitutional:      General: She is not in acute distress. Neck:     Vascular: No JVD.  Cardiovascular:     Rate and Rhythm: Normal rate and regular rhythm.     Heart sounds: Normal heart sounds. No murmur heard. Pulmonary:     Effort: Pulmonary effort is normal.     Breath sounds: Normal breath sounds.  Musculoskeletal:     Right lower leg: No edema.     Left lower leg: No edema.         Assessment & Recommendations:    56 y.o. Caucasian female with hypertension, Hashimoto thyroiditis, hypothyroidism, PVCs   PVCs: Most likely cause of her shortness of breath.  Increase metoprolol tartrate to 25 mg twice daily. We will repeat cardiac telemetry to assess PVC burden.  Hypertension: Well-controlled  F/u in 4 weeks    [employer], PA-C 03/10/2022, 9:41 AM Office: [phone]

## 2022-03-23 DIAGNOSIS — I493 Ventricular premature depolarization: Secondary | ICD-10-CM | POA: Diagnosis not present

## 2022-04-04 DIAGNOSIS — R5383 Other fatigue: Secondary | ICD-10-CM | POA: Diagnosis not present

## 2022-04-04 DIAGNOSIS — F419 Anxiety disorder, unspecified: Secondary | ICD-10-CM | POA: Diagnosis not present

## 2022-04-04 DIAGNOSIS — G479 Sleep disorder, unspecified: Secondary | ICD-10-CM | POA: Diagnosis not present

## 2022-04-08 ENCOUNTER — Encounter: Payer: Self-pay | Admitting: Cardiology

## 2022-04-08 ENCOUNTER — Ambulatory Visit: Payer: BC Managed Care – PPO | Admitting: Cardiology

## 2022-04-08 VITALS — BP 131/74 | HR 66 | Temp 98.0°F | Resp 16 | Ht 64.0 in | Wt 193.0 lb

## 2022-04-08 DIAGNOSIS — I10 Essential (primary) hypertension: Secondary | ICD-10-CM

## 2022-04-08 DIAGNOSIS — I493 Ventricular premature depolarization: Secondary | ICD-10-CM

## 2022-04-08 MED ORDER — DILTIAZEM HCL ER COATED BEADS 180 MG PO CP24: 180.0000 mg | ORAL_CAPSULE | Freq: Every day | ORAL | 3 refills | Status: DC

## 2022-04-08 NOTE — Progress Notes (Signed)
*/   Patient referred by Willey Blade, MD for palpitations  Subjective:   Jaclyn Murray, female    DOB: [birth year], 56 y.o.   MRN: [id number]   Chief Complaint  Patient presents with   PVC   Follow-up    4 week   Results    monitor     HPI  56 y.o. Caucasian female with hypertension, Hashimoto thyroiditis, hypothyroidism, PVCs   Patient continues to have shortness of breath and palpitation symptoms. Recent Zio patch showed 18% PVC burden (03/2022). She has been miserable on metoprolol tartrate 25 mg bid, due to fatigue, mood changes, lightheadedness.    Current Outpatient Medications:    Cholecalciferol (VITAMIN D) 125 MCG (5000 UT) CAPS, Take 2,000 Units by mouth 2 (two) times daily., Disp: , Rfl:    hydrochlorothiazide (MICROZIDE) 12.5 MG capsule, Take 12.5-25 mg by mouth as needed., Disp: , Rfl:    losartan (COZAAR) 100 MG tablet, Take 100 mg by mouth daily., Disp: , Rfl:    Magnesium 300 MG CAPS, Take by mouth daily., Disp: , Rfl:    Melatonin 5 MG CAPS, Take 5 mg by mouth daily., Disp: , Rfl:    metoprolol tartrate (LOPRESSOR) 25 MG tablet, Take 1 tablet (25 mg total) by mouth 2 (two) times daily., Disp: 180 tablet, Rfl: 3   NON FORMULARY, Take 1 capsule by mouth daily. neurocalm, Disp: , Rfl:    NON FORMULARY, , Disp: , Rfl:    NON FORMULARY, Take 1 tablet by mouth daily. thyrosol, Disp: , Rfl:    S-Adenosylmethionine (SAM-E COMPLETE PO), Take 1 tablet by mouth daily., Disp: , Rfl:    SYNTHROID 25 MCG tablet, Take 25 mcg by mouth every morning., Disp: , Rfl:   Cardiovascular and other pertinent studies:  Mobile cardiac telemetry 7 days 03/10/2022 - 03/17/2022: Dominant rhythm: Sinus. HR 47-108 bpm. Avg HR 60 bpm, in sinus rhythm. 3 episodes of atrial tachycardia, fastest at 133 bpm for 7 beats, longest for 9 beats at 107 bpm. <1% isolated SVE, couplets. 0 episodes of VT. 18% isolated VE, <1% couplets. No atrial fibrillation/atrial flutter/VT/high grade AV  block, sinus pause >3sec noted. 12 patient triggered events, correlating with VE.  EKG 03/10/2022: Sinus rhyhtm 59 bpm Low voltage in precordial leads   PCV CARDIAC STRESS TEST 06/14/2021 Exercise treadmill stress test performed using Bruce protocol.  Patient reached 7.3 METS, and 90% of age predicted maximum heart rate.  Exercise capacity was low normal.  No chest pain reported.  Dyspnea reported.  Normal heart rate response. Resting and exercise hypertension. Peak BP 220/100 mmHg. Stress EKG revealed no ischemic changes. No arrythmia noted. Low risk study.  PCV ECHOCARDIOGRAM COMPLETE 06/14/2021 Left ventricle cavity is normal in size and wall thickness. Normal global wall motion. Normal LV systolic function with visual EF 67%. Doppler evidence of grade I (impaired) diastolic dysfunction, normal LAP. No significant valvular abnormality. No evidence of pulmonary hypertension.   Recent labs: 05/20/2021: TSH 5.08 (elevated) Magnesium 2.2 Sodium 140, potassium 3.9, BUN 14, creatinine 0.76, GFR >60   Review of Systems  Constitutional: Positive for malaise/fatigue.  Cardiovascular:  Positive for dyspnea on exertion and palpitations. Negative for chest pain, leg swelling and syncope.  Respiratory:  Negative for shortness of breath.          Vitals:   04/08/22 1052  BP: 131/74  Pulse: 66  Resp: 16  Temp: 98 F (36.7 C)  SpO2: 96%   Orthostatic VS for the past 72  hrs (Last 3 readings):  Orthostatic BP Patient Position BP Location Cuff Size Orthostatic Pulse  04/08/22 1111 130/75 Standing Left Arm Large 70  04/08/22 1109 135/80 Sitting Left Arm Large 60  04/08/22 1106 131/73 Supine Left Arm Large 59     Body mass index is 33.13 kg/m. Filed Weights   04/08/22 1052  Weight: 193 lb (87.5 kg)     Objective:   Physical Exam Vitals reviewed.  Constitutional:      General: She is not in acute distress. Neck:     Vascular: No JVD.  Cardiovascular:     Rate and Rhythm:  Normal rate and regular rhythm.     Heart sounds: Normal heart sounds. No murmur heard. Pulmonary:     Effort: Pulmonary effort is normal.     Breath sounds: Normal breath sounds.  Musculoskeletal:     Right lower leg: No edema.     Left lower leg: No edema.         Assessment & Recommendations:    56 y.o. 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 Also has side effects from metoprolol- fatigue, mood changes, dizziness. Changed to diltiazem 180 mg daily.  In addition, also referred to electrophysiology.  Hypertension: Controlled Orthostatics negative  F/u in 4 weeks unless already seen by EP by then    [employer], PA-C 04/08/2022, 10:13 AM Office: [phone]

## 2022-04-21 ENCOUNTER — Encounter: Payer: Self-pay | Admitting: Cardiology

## 2022-04-21 NOTE — Telephone Encounter (Signed)
From pt

## 2022-05-05 ENCOUNTER — Ambulatory Visit: Payer: BC Managed Care – PPO | Admitting: Cardiology

## 2022-05-18 DIAGNOSIS — R002 Palpitations: Secondary | ICD-10-CM | POA: Diagnosis not present

## 2022-05-18 DIAGNOSIS — Z6833 Body mass index (BMI) 33.0-33.9, adult: Secondary | ICD-10-CM | POA: Diagnosis not present

## 2022-05-18 DIAGNOSIS — E063 Autoimmune thyroiditis: Secondary | ICD-10-CM | POA: Diagnosis not present

## 2022-05-18 DIAGNOSIS — E876 Hypokalemia: Secondary | ICD-10-CM | POA: Diagnosis not present

## 2022-06-06 ENCOUNTER — Ambulatory Visit: Payer: BC Managed Care – PPO | Attending: Cardiology | Admitting: Cardiology

## 2022-06-06 ENCOUNTER — Encounter: Payer: Self-pay | Admitting: Cardiology

## 2022-06-06 VITALS — BP 118/70 | HR 71 | Ht 64.0 in | Wt 196.2 lb

## 2022-06-06 DIAGNOSIS — I10 Essential (primary) hypertension: Secondary | ICD-10-CM

## 2022-06-06 DIAGNOSIS — I493 Ventricular premature depolarization: Secondary | ICD-10-CM | POA: Diagnosis not present

## 2022-06-06 MED ORDER — FLECAINIDE ACETATE 100 MG PO TABS: 100.0000 mg | ORAL_TABLET | Freq: Two times a day (BID) | ORAL | 3 refills | Status: DC

## 2022-06-06 NOTE — Progress Notes (Signed)
Electrophysiology Office Note   Date:  06/06/2022   ID:  Jaclyn Murray, Jaclyn Murray 11-16-1965, MRN [id number]  PCP:  Willey Blade, MD  Cardiologist:  Virgina Jock Primary Electrophysiologist:  Minnah Llamas Meredith Leeds, MD    Chief Complaint: PVC   History of Present Illness: Jaclyn Murray is a 56 y.o. female who is being seen today for the evaluation of PVC at the request of Patwardhan, Reynold Bowen, MD. Presenting today for electrophysiology evaluation.  She has a history significant for Hashimoto's thyroiditis, hypertension, PVCs.  She wore a cardiac monitor that showed an 18% PVC burden.  She feels poorly on metoprolol with fatigue, mood changes, lightheadedness.  Fortunately her ejection fraction is remained normal.  When she has palpitations, she feels weak, fatigued, short of breath.  She states that they come and go.  She is quite frustrated with her arrhythmia.  Today, she denies symptoms of palpitations, chest pain, shortness of breath, orthopnea, PND, lower extremity edema, claudication, dizziness, presyncope, syncope, bleeding, or neurologic sequela. The patient is tolerating medications without difficulties.    Past Medical History:  Diagnosis Date   Depression    Dyspareunia    "dryness"   Gluten free diet    Hashimoto's disease    per pt dx 2016 approx.  , unable to tolerate oral thyroid medication, followed by pcp   Hypertension    PMB (postmenopausal bleeding)    Submucous uterine fibroid    Wears glasses    Past Surgical History:  Procedure Laterality Date   COLONOSCOPY WITH PROPOFOL  05/2016   CYSTOSCOPY N/A 02/07/2017   Procedure: CYSTOSCOPY;  Surgeon: Nunzio Cobbs, MD;  Location: [facility];  Service: Gynecology;  Laterality: N/A;   DILATATION & CURETTAGE/HYSTEROSCOPY WITH MYOSURE N/A 02/07/2017   Procedure: DILATATION & CURETTAGE/HYSTEROSCOPY WITH   RESECTOSCOPE OF SUBMUCOSAL FIBROID, SUTURING OF CERVICAL LACERATION;   Surgeon: Nunzio Cobbs, MD;  Location: [facility];  Service: Gynecology;  Laterality: N/A;   TONSILLECTOMY AND ADENOIDECTOMY  age 59     Current Outpatient Medications  Medication Sig Dispense Refill   Alpha-Lipoic Acid 200 MG CAPS Take by mouth. 1 qd     B Complex-Biotin-FA (B COMPLETE PO) Take by mouth.     Berberine Chloride (BERBERINE HCI PO) Take by mouth. 550 mg daily     Bioflavonoid Products (ESTER-C/BIOFLAVONOIDS PO) Take by mouth. 619 mg daily     Cholecalciferol (VITAMIN D) 125 MCG (5000 UT) CAPS Take 2,000 Units by mouth 2 (two) times daily.     flecainide (TAMBOCOR) 100 MG tablet Take 1 tablet (100 mg total) by mouth 2 (two) times daily. 180 tablet 3   hydrochlorothiazide (MICROZIDE) 12.5 MG capsule Take 12.5-25 mg by mouth as needed.     losartan (COZAAR) 100 MG tablet Take 100 mg by mouth daily.     Magnesium 300 MG CAPS Take by mouth daily.     Melatonin 5 MG CAPS Take 5 mg by mouth daily.     Milk Thistle-Turmeric (SILYMARIN PO) Take by mouth. 250 MG     potassium chloride (KLOR-CON) 10 MEQ tablet Take 10 mEq by mouth daily.     Pyridoxine HCl (VITAMIN B6) 50 MG TABS      S-Adenosylmethionine (SAM-E COMPLETE PO) Take 1 tablet by mouth daily.     VITAMIN A PO Take by mouth. 260 mg daily     Zinc Sulfate (ZINC 15 PO)      No  current facility-administered medications for this visit.    Allergies:   Ace inhibitors, Ciprofloxacin, Corn-containing products, Wheat bran, and Erythromycin   Social History:  The patient  reports that she has never smoked. She has never used smokeless tobacco. She reports current alcohol use of about 1.0 standard drink of alcohol per week. She reports that she does not use drugs.   Family History:  The patient's family history includes Cancer (age of onset: 26) in her maternal grandmother; Cancer (age of onset: 80) in her paternal grandfather; Dementia in her mother; Diabetes in her sister and sister; Hypertension in  her maternal grandfather, mother, sister, and sister; Parkinson's disease in her father; Stroke in her maternal grandmother; Thyroid disease in her maternal grandmother and sister.    ROS:  Please see the history of present illness.   Otherwise, review of systems is positive for none.   All other systems are reviewed and negative.    PHYSICAL EXAM: VS:  BP 118/70   Pulse 71   Ht '5\' 4"'$  (1.626 m)   Wt 196 lb 3.2 oz (89 kg)   LMP 08/08/2014 (Approximate)   SpO2 96%   BMI 33.68 kg/m  , BMI Body mass index is 33.68 kg/m. GEN: Well nourished, well developed, in no acute distress  HEENT: normal  Neck: no JVD, carotid bruits, or masses Cardiac: RRR; no murmurs, rubs, or gallops,no edema  Respiratory:  clear to auscultation bilaterally, normal work of breathing GI: soft, nontender, nondistended, + BS MS: no deformity or atrophy  Skin: warm and dry Neuro:  Strength and sensation are intact Psych: euthymic mood, full affect  EKG:  EKG is ordered today. Personal review of the ekg ordered shows sinus rhythm, PVCs  Recent Labs: No results found for requested labs within last 365 days.    Lipid Panel  No results found for: "CHOL", "TRIG", "HDL", "CHOLHDL", "VLDL", "LDLCALC", "LDLDIRECT"   Wt Readings from Last 3 Encounters:  06/06/22 196 lb 3.2 oz (89 kg)  04/08/22 193 lb (87.5 kg)  03/10/22 194 lb 12.8 oz (88.4 kg)      Other studies Reviewed: Additional studies/ records that were reviewed today include: Cardiac monitor 04/08/2022 personally reviewed Review of the above records today demonstrates:  Dominant rhythm: Sinus. HR 47-108 bpm. Avg HR 60 bpm, in sinus rhythm. 3 episodes of atrial tachycardia, fastest at 133 bpm for 7 beats, longest for 9 beats at 107 bpm. <1% isolated SVE, couplets. 0 episodes of VT. 18% isolated VE, <1% couplets. No atrial fibrillation/atrial flutter/VT/high grade AV block, sinus pause >3sec noted. 12 patient triggered events, correlating with  VE.  TTE 06/14/2021 Left ventricle cavity is normal in size and wall thickness. Normal global  wall motion. Normal LV systolic function with visual EF 67%. Doppler  evidence of grade I (impaired) diastolic dysfunction, normal LAP. No  significant valvular abnormality.  No evidence of pulmonary hypertension.   ASSESSMENT AND PLAN:  1.  PVCs: 18% burden on cardiac monitor.  Currently on diltiazem 180 mg daily.  She feels quite poorly from her PVCs.  I think that rhythm control Chelle Cayton be beneficial.  We discussed ablation versus medication management.  As she is feeling so poorly, Finnick Orosz start on flecainide 100 mg twice daily.  We Hanna Ra get an exercise treadmill test for flecainide monitoring.  If her flecainide is not controlling her symptoms, she would be amenable to ablation.  2.  Hypertension: Currently well controlled   Current medicines are reviewed at length with the  patient today.   The patient does not have concerns regarding her medicines.  The following changes were made today:  none  Labs/ tests ordered today include:  Orders Placed This Encounter  Procedures   EKG 12-Lead     Disposition:   FU with Kahlin Mark 3 months  Signed, Nolan Lasser Meredith Leeds, MD  06/06/2022 10:40 AM     [facility] [address] [place] [address] [phone] (office) [phone] (fax)

## 2022-06-06 NOTE — Patient Instructions (Signed)
Medication Instructions:  Your physician has recommended you make the following change in your medication:   ** Begin Flecainide '100mg'$  - 1 tablet by mouth twice daily  *If you need a refill on your cardiac medications before your next appointment, please call your pharmacy*   Lab Work: None ordered.  If you have labs (blood work) drawn today and your tests are completely normal, you will receive your results only by: [place] (if you have MyChart) OR A paper copy in the mail If you have any lab test that is abnormal or we need to change your treatment, we will call you to review the results.   Testing/Procedures: ECG 2 weeks after starting Flecainide.   Follow-Up: At [facility], you and your health needs are our priority.  As part of our continuing mission to provide you with exceptional heart care, we have created designated Provider Care Teams.  These Care Teams include your primary Cardiologist (physician) and Advanced Practice Providers (APPs -  Physician Assistants and Nurse Practitioners) who all work together to provide you with the care you need, when you need it.  We recommend signing up for the patient portal called "MyChart".  Sign up information is provided on this After Visit Summary.  MyChart is used to connect with patients for Virtual Visits (Telemedicine).  Patients are able to view lab/test results, encounter notes, upcoming appointments, etc.  Non-urgent messages can be sent to your provider as well.   To learn more about what you can do with MyChart, go to [email or web address].    Your next appointment:   Nurse visit for ECG (Flecainide start) on Monday 07/04/2022  3 months with Dr Curt Bears  Important Information About Sugar

## 2022-07-04 ENCOUNTER — Ambulatory Visit: Payer: BC Managed Care – PPO | Attending: Cardiovascular Disease | Admitting: *Deleted

## 2022-07-04 DIAGNOSIS — I493 Ventricular premature depolarization: Secondary | ICD-10-CM

## 2022-07-04 NOTE — Patient Instructions (Addendum)
   Nurse Visit   Date of Encounter: 07/04/2022 ID: Jaclyn Murray, DOB 11-28-1965, MRN [id number]  PCP:  Willey Blade, [place] Providers Cardiologist:  None      Visit Details   VS:  LMP 08/08/2014 (Approximate)  , BMI There is no height or weight on file to calculate BMI.  Wt Readings from Last 3 Encounters:  06/06/22 196 lb 3.2 oz (89 kg)  04/08/22 193 lb (87.5 kg)  03/10/22 194 lb 12.8 oz (88.4 kg)     Reason for visit: EKG after Flecainde start 06/19/22. She is on Flecainide 100 mg bid. Performed today: EKG: Dr. Johnsie Cancel (DOD) reviewed  Changes (medications, testing, etc.) : No changes today but let the patient know will have Dr. Curt Bears review.  She felt better after first starting the medication but on 11/24 she began having symptoms again.  She c/o HA, increased BP (164/62 today at home), SOB, fullness in chest, palpitations, and no energy.  States "it's been a roller coaster since 11/24".  She does have a few hours out of a 24 hour period she feels okay.  She has data from her watch that she is going to send over for Dr. Curt Bears to review.  Will try to send via MyChart if not successful will email.  Says at times her watch alerts her for low HR (in the 30's) but knows its the PVC's as when she looks at her watch they are frequent. Length of Visit: 20 minutes       Signed, Janan Halter, RN  07/04/2022 11:21 AM

## 2022-07-10 ENCOUNTER — Encounter: Payer: Self-pay | Admitting: Cardiology

## 2022-07-13 NOTE — Telephone Encounter (Signed)
Pt still having a lot of PVC issues. States yesterday and today have been awful. Aware I will discuss another medication to try until such time as ablation is possible next April. She is agreeable to plan.

## 2022-07-14 DIAGNOSIS — I10 Essential (primary) hypertension: Secondary | ICD-10-CM | POA: Diagnosis not present

## 2022-07-14 DIAGNOSIS — R7309 Other abnormal glucose: Secondary | ICD-10-CM | POA: Diagnosis not present

## 2022-07-14 DIAGNOSIS — E063 Autoimmune thyroiditis: Secondary | ICD-10-CM | POA: Diagnosis not present

## 2022-07-14 DIAGNOSIS — E559 Vitamin D deficiency, unspecified: Secondary | ICD-10-CM | POA: Diagnosis not present

## 2022-07-14 DIAGNOSIS — E782 Mixed hyperlipidemia: Secondary | ICD-10-CM | POA: Diagnosis not present

## 2022-07-18 ENCOUNTER — Other Ambulatory Visit: Payer: Self-pay | Admitting: Internal Medicine

## 2022-07-18 ENCOUNTER — Encounter: Payer: Self-pay | Admitting: Cardiology

## 2022-07-18 DIAGNOSIS — Z1231 Encounter for screening mammogram for malignant neoplasm of breast: Secondary | ICD-10-CM

## 2022-07-18 NOTE — Telephone Encounter (Signed)
Apologized for the delay. Aware I have placed it on the top of his review pile for tomorrow.

## 2022-07-19 NOTE — Telephone Encounter (Signed)
Pt aware Dr. Curt Bears recommends starting Mexiletine 250 mg BID.  She understands I will send her some information via mychart on the medication. Informed that I will confirm w/ pharmD whether a washout is needed prior to starting the Mexiletine. Aware I will let her know tomorrow  (if she does not answer,send detailed mychart message). Patient verbalized understanding and agreeable to plan.

## 2022-07-20 MED ORDER — MEXILETINE HCL 250 MG PO CAPS: 250.0000 mg | ORAL_CAPSULE | Freq: Two times a day (BID) | ORAL | 3 refills | Status: DC

## 2022-07-20 NOTE — Telephone Encounter (Signed)
Pt advised 3 day washout. She will start Mexiletine on Friday. Aware I will send information via mychart. Pt very thankful for my call and agreeable to plan.

## 2022-07-26 DIAGNOSIS — D1801 Hemangioma of skin and subcutaneous tissue: Secondary | ICD-10-CM | POA: Diagnosis not present

## 2022-07-26 DIAGNOSIS — D2239 Melanocytic nevi of other parts of face: Secondary | ICD-10-CM | POA: Diagnosis not present

## 2022-07-26 DIAGNOSIS — L813 Cafe au lait spots: Secondary | ICD-10-CM | POA: Diagnosis not present

## 2022-07-26 DIAGNOSIS — D2261 Melanocytic nevi of right upper limb, including shoulder: Secondary | ICD-10-CM | POA: Diagnosis not present

## 2022-08-11 NOTE — Progress Notes (Signed)
57 y.o. G0P0 Married Caucasian female here for NEW GYN/annual exam.    Dealing with vaginal dryness.  Did not like estrogen cream, which was messy.  She will try Neueve vaginal suppository.   No vaginal discharge or odor.  Itching sometimes. She declines vaginitis evaluation today.   Dealing with PVCs.  May have an ablation.   Moving to [place] this coming summer.   PCP:   Willey Blade, MD  Patient's last menstrual period was 08/08/2014 (approximate).           Sexually active: No.  The current method of family planning is post menopausal status.    Exercising: No.     Smoker:  no  Health Maintenance: Pap:  12/02/16 negative/neg HR HPV, 05/24/13 neg History of abnormal Pap:  yes, many years ago MMG:  09/25/20 Breast Density Category B, BI-RADS CATEGORY 1 Negative.  She will do in February.  Colonoscopy:  2023 per pt - due in 10 years. BMD:   n/a  Result  n/a TDaP:  2010 or 2011 per pt.  She will do at pharmacy.  Gardasil:   no Screening Labs:  PCP   reports that she has never smoked. She has never used smokeless tobacco. She reports current alcohol use of about 1.0 standard drink of alcohol per week. She reports that she does not use drugs.  Past Medical History:  Diagnosis Date   Depression    Dyspareunia    "dryness"   Gluten free diet    Hashimoto's disease    per pt dx 2016 approx.  , unable to tolerate oral thyroid medication, followed by pcp   Hypertension    PMB (postmenopausal bleeding)    Submucous uterine fibroid    Wears glasses     Past Surgical History:  Procedure Laterality Date   COLONOSCOPY WITH PROPOFOL  05/2016   CYSTOSCOPY N/A 02/07/2017   Procedure: CYSTOSCOPY;  Surgeon: Nunzio Cobbs, MD;  Location: [facility];  Service: Gynecology;  Laterality: N/A;   DILATATION & CURETTAGE/HYSTEROSCOPY WITH MYOSURE N/A 02/07/2017   Procedure: DILATATION & CURETTAGE/HYSTEROSCOPY WITH   RESECTOSCOPE OF SUBMUCOSAL FIBROID, SUTURING  OF CERVICAL LACERATION;  Surgeon: Nunzio Cobbs, MD;  Location: [facility];  Service: Gynecology;  Laterality: N/A;   TONSILLECTOMY AND ADENOIDECTOMY  age 56    Current Outpatient Medications  Medication Sig Dispense Refill   ALPRAZolam (XANAX) 0.25 MG tablet TAKE 1 TO 2 TABLETS BY MOUTH DAILY AS NEEDED     B Complex-Biotin-FA (B COMPLETE PO) Take by mouth.     Bioflavonoid Products (ESTER-C/BIOFLAVONOIDS PO) Take by mouth. 619 mg daily     Cholecalciferol (VITAMIN D) 125 MCG (5000 UT) CAPS Take 2,000 Units by mouth 2 (two) times daily.     hydrochlorothiazide (MICROZIDE) 12.5 MG capsule Take 12.5-25 mg by mouth as needed.     losartan (COZAAR) 100 MG tablet Take 100 mg by mouth daily.     Magnesium 300 MG CAPS Take by mouth daily.     Melatonin 5 MG CAPS Take 5 mg by mouth daily.     Nutritional Supplements (ANTIOXIDANTS PO)      potassium chloride (KLOR-CON) 10 MEQ tablet Take 10 mEq by mouth daily.     Milk Thistle-Turmeric (SILYMARIN PO) Take by mouth. 250 MG (Patient not taking: Reported on 08/24/2022)     Pyridoxine HCl (VITAMIN B6) 50 MG TABS  (Patient not taking: Reported on 08/24/2022)  S-Adenosylmethionine (SAM-E COMPLETE PO) Take 1 tablet by mouth daily. (Patient not taking: Reported on 08/24/2022)     VITAMIN A PO Take by mouth. 260 mg daily (Patient not taking: Reported on 08/24/2022)     Zinc Sulfate (ZINC 15 PO)  (Patient not taking: Reported on 08/24/2022)     No current facility-administered medications for this visit.    Family History  Problem Relation Age of Onset   Dementia Mother    Hypertension Mother    Parkinson's disease Father    Diabetes Sister    Hypertension Sister    Thyroid disease Sister    Diabetes Sister    Hypertension Sister    Cancer Maternal Grandmother 25       colon ca   Stroke Maternal Grandmother    Thyroid disease Maternal Grandmother    Cancer Paternal Grandfather 36       colon ca   Hypertension Maternal  Grandfather    Breast cancer Neg Hx     Review of Systems  All other systems reviewed and are negative.   Exam:   BP 124/88 (BP Location: Right Arm, Patient Position: Sitting, Cuff Size: Normal)   Pulse 77   Ht '5\' 4"'$  (1.626 m)   Wt 191 lb (86.6 kg)   LMP 08/08/2014 (Approximate)   SpO2 100%   BMI 32.79 kg/m     General appearance: alert, cooperative and appears stated age Head: normocephalic, without obvious abnormality, atraumatic Neck: no adenopathy, supple, symmetrical, trachea midline and thyroid normal to inspection and palpation Lungs: clear to auscultation bilaterally Breasts: normal appearance, no masses or tenderness, No nipple retraction or dimpling, No nipple discharge or bleeding, No axillary adenopathy Heart: regular rate and rhythm Abdomen: soft, non-tender; no masses, no organomegaly Extremities: extremities normal, atraumatic, no cyanosis or edema Skin: skin color, texture, turgor normal. No rashes or lesions Lymph nodes: cervical, supraclavicular, and axillary nodes normal. Neurologic: grossly normal  Pelvic: External genitalia:  no lesions              No abnormal inguinal nodes palpated.              Urethra:  normal appearing urethra with no masses, tenderness or lesions              Bartholins and Skenes: normal                 Vagina: normal appearing vagina with normal color and discharge, no lesions              Cervix: no lesions              Pap taken: yes Bimanual Exam:  Uterus:  normal size, contour, position, consistency, mobility, non-tender              Adnexa: no mass, fullness, tenderness              Rectal exam: yes.  Confirms.              Anus:  normal sphincter tone, no lesions  Chaperone was present for exam:  Raquel Sarna  Assessment:   Well woman visit with gynecologic exam. Cervical cancer screening.  Vaginal atrophy.  PVCs.   Plan: Mammogram screening discussed. Self breast awareness reviewed. Pap and HR HPV as above. Guidelines  for Calcium, Vitamin D, regular exercise program including cardiovascular and weight bearing exercise. We discussed vaginal vit E and cooking oil for atrophy.  We did briefly discuss vaginal estrogen alternatives of  tablets or a ring.   Follow up annually and prn.   After visit summary provided.

## 2022-08-12 NOTE — Telephone Encounter (Signed)
Attempted to reach pt, no answer/no voicemail   (? Restart Flecainide vs try Rythmol 325 BID vs ablation)

## 2022-08-16 ENCOUNTER — Encounter: Payer: BLUE CROSS/BLUE SHIELD | Admitting: Obstetrics and Gynecology

## 2022-08-18 NOTE — Telephone Encounter (Addendum)
Pt reports complete resolution of symptoms after stopping the Mexiletine. She is feeling great, and  has not been experiencing PVCs recently. States she can feel hers, and she feels "crappy when having them", but confirms she has not been having them recently. She is leaving next week for [place] and will return at the end of the month. She does not want to start anything new prior to leaving, and being that she feels well. Aware that I will forward to Dr. Curt Bears for his Juluis Rainier and if he has other advisement since we are not trying a different medication at this time per pt preference. She will let us know if need to readdress prior to her PVC ablation in April. Aware I will only call back if Dr. Curt Bears has other advisement. Patient verbalized understanding and agreeable to plan.

## 2022-08-24 ENCOUNTER — Ambulatory Visit (INDEPENDENT_AMBULATORY_CARE_PROVIDER_SITE_OTHER): Payer: BC Managed Care – PPO | Admitting: Obstetrics and Gynecology

## 2022-08-24 ENCOUNTER — Other Ambulatory Visit (HOSPITAL_COMMUNITY)
Admission: RE | Admit: 2022-08-24 | Discharge: 2022-08-24 | Disposition: A | Discharge status: Home / Self Care | Payer: BC Managed Care – PPO | Source: Ambulatory Visit | Attending: Obstetrics and Gynecology | Admitting: Obstetrics and Gynecology

## 2022-08-24 ENCOUNTER — Encounter: Payer: Self-pay | Admitting: Obstetrics and Gynecology

## 2022-08-24 VITALS — BP 124/88 | HR 77 | Ht 64.0 in | Wt 191.0 lb

## 2022-08-24 DIAGNOSIS — Z01419 Encounter for gynecological examination (general) (routine) without abnormal findings: Secondary | ICD-10-CM | POA: Diagnosis not present

## 2022-08-24 DIAGNOSIS — Z124 Encounter for screening for malignant neoplasm of cervix: Secondary | ICD-10-CM | POA: Insufficient documentation

## 2022-08-24 NOTE — Patient Instructions (Signed)

## 2022-08-26 LAB — CYTOLOGY - PAP
Comment: NEGATIVE
Diagnosis: NEGATIVE
High risk HPV: NEGATIVE

## 2022-09-05 ENCOUNTER — Ambulatory Visit: Payer: BC Managed Care – PPO | Admitting: Cardiology

## 2022-09-08 ENCOUNTER — Ambulatory Visit: Payer: BC Managed Care – PPO | Attending: Cardiology | Admitting: Cardiology

## 2022-09-08 ENCOUNTER — Encounter: Payer: Self-pay | Admitting: Cardiology

## 2022-09-08 VITALS — BP 136/82 | HR 65 | Ht 64.0 in | Wt 189.2 lb

## 2022-09-08 DIAGNOSIS — I472 Ventricular tachycardia, unspecified: Secondary | ICD-10-CM | POA: Diagnosis not present

## 2022-09-08 MED ORDER — MEXILETINE HCL 150 MG PO CAPS: 150.0000 mg | ORAL_CAPSULE | Freq: Two times a day (BID) | ORAL | 3 refills | Status: DC

## 2022-09-08 NOTE — Patient Instructions (Signed)
Medication Instructions:  Your physician has recommended you make the following change in your medication: START Mexiletine 150 mg twice daily  *If you need a refill on your cardiac medications before your next appointment, please call your pharmacy*   Lab Work: None ordered   Testing/Procedures: Your physician has recommended that you have an ablation. Catheter ablation is a medical procedure used to treat some cardiac arrhythmias (irregular heartbeats). During catheter ablation, a long, thin, flexible tube is put into a blood vessel in your groin (upper thigh), or neck. This tube is called an ablation catheter. It is then guided to your heart through the blood vessel. Radio frequency waves destroy small areas of heart tissue where abnormal heartbeats may cause an arrhythmia to start.   Please let us know by the end of the month or by middle of March if you want to proceed with ablation   Follow-Up: At [facility], you and your health needs are our priority.  As part of our continuing mission to provide you with exceptional heart care, we have created designated Provider Care Teams.  These Care Teams include your primary Cardiologist (physician) and Advanced Practice Providers (APPs -  Physician Assistants and Nurse Practitioners) who all work together to provide you with the care you need, when you need it.   Your next appointment:   To be  determined  The format for your next appointment:   In Person  Provider:   Allegra Lai, MD{   Thank you for choosing [facility]!!   Trinidad Curet, RN [phone]  Other Instructions  Mexiletine Capsules What is this medication? MEXILETINE (mex [place] e teen) treats a fast or irregular heartbeat (arrhythmia). It works by slowing down overactive electric signals in the heart, which stabilizes your heart rhythm. It belongs to a group of medications called antiarrhythmics. This medicine may be used for other purposes; ask your health  care provider or pharmacist if you have questions. COMMON BRAND NAME(S): Mexitil What should I tell my care team before I take this medication? They need to know if you have any of these conditions: Liver disease Other heart problems Previous heart attack An unusual or allergic reaction to mexiletine, other medications, foods, dyes, or preservatives Pregnant or trying to get pregnant Breast-feeding How should I use this medication? Take this medication by mouth with a glass of water. Follow the directions on the prescription label. It is recommended that you take this medication with food or an antacid. Take your doses at regular intervals. Do not take your medication more often than directed. Do not stop taking except on the advice of your care team. Talk to your care team about the use of this medication in children. Special care may be needed. Overdosage: If you think you have taken too much of this medicine contact a poison control center or emergency room at once. NOTE: This medicine is only for you. Do not share this medicine with others. What if I miss a dose? If you miss a dose, take it as soon as you can. If it is almost time for your next dose, take only that dose. Do not take double or extra doses. What may interact with this medication? Do not take this medication with any of the following: Dofetilide This medication may also interact with the following: Caffeine Cimetidine Medications for depression, anxiety, or psychotic disturbances Medications to control heart rhythm Phenobarbital Phenytoin Rifampin Theophylline This list may not describe all possible interactions. Give your health care  provider a list of all the medicines, herbs, non-prescription drugs, or dietary supplements you use. Also tell them if you smoke, drink alcohol, or use illegal drugs. Some items may interact with your medicine. What should I watch for while using this medication? Your condition will be  monitored closely when you first begin therapy. Often, this medication is first started in a hospital or other monitored health care setting. Once you are on maintenance therapy, visit your care team for regular checks on your progress. Because your condition and use of this medication carry some risk, it is a good idea to carry an identification card, necklace or bracelet with details of your condition, medications, and care team. You may get drowsy or dizzy. Do not drive, use machinery, or do anything that needs mental alertness until you know how this medication affects you. Do not stand or sit up quickly, especially if you are an older patient. This reduces the risk of dizzy or fainting spells. Alcohol can make you more dizzy, increase flushing and rapid heartbeats. Avoid alcoholic drinks. This medication may cause serious skin reactions. They can happen weeks to months after starting the medication. Contact your care team right away if you notice fevers or flu-like symptoms with a rash. The rash may be red or purple and then turn into blisters or peeling of the skin. Or, you might notice a red rash with swelling of the face, lips or lymph nodes in your neck or under your arms. What side effects may I notice from receiving this medication? Side effects that you should report to your care team as soon as possible: Allergic reactions--skin rash, itching, hives, swelling of the face, lips, tongue, or throat Heart rhythm changes--fast or irregular heartbeat, dizziness, feeling faint or lightheaded, chest pain, trouble breathing Infection--fever, chills, cough, or sore throat Liver injury--right upper belly pain, loss of appetite, nausea, light-colored stool, dark yellow or brown urine, yellowing skin or eyes, unusual weakness or fatigue Rash, fever, and swollen lymph nodes Seizures Unusual bruising or bleeding Side effects that usually do not require medical attention (report to your care team if they  continue or are bothersome): Anxiety, nervousness Blurry vision Dizziness Headache Heartburn Nausea Tremors or shaking Vomiting This list may not describe all possible side effects. Call your doctor for medical advice about side effects. You may report side effects to FDA at 1-800-FDA-1088. Where should I keep my medication? Keep out of reach of children. Store at room temperature between 15 and 30 degrees C (59 and 86 degrees F). Throw away any unused medication after the expiration date. NOTE: This sheet is a summary. It may not cover all possible information. If you have questions about this medicine, talk to your doctor, pharmacist, or health care provider.  2023 Elsevier/Gold Standard (2021-02-03 00:00:00)

## 2022-09-08 NOTE — Progress Notes (Signed)
Electrophysiology Office Note   Date:  09/08/2022   ID:  Jaclyn, Murray 16-Feb-1966, MRN [id number]  PCP:  Willey Blade, MD  Cardiologist:  Virgina Jock Primary Electrophysiologist:  Reyn Faivre Meredith Leeds, MD    Chief Complaint: PVC   History of Present Illness: Jaclyn Murray is a 57 y.o. female who is being seen today for the evaluation of PVC at the request of Willey Blade, MD. Presenting today for electrophysiology evaluation.  She has a history significant for Hashimoto's thyroiditis, hypertension, PVCs.  She wore a cardiac monitor that showed an 18% PVC burden.  She felt poor on metoprolol.  She has weakness, fatigue, shortness of breath due to her PVCs.  She has since been started on flecainide.  Today, denies symptoms of palpitations, chest pain, shortness of breath, orthopnea, PND, lower extremity edema, claudication, dizziness, presyncope, syncope, bleeding, or neurologic sequela. The patient is tolerating medications without difficulties.  Continue to have PVCs.  She tried both flecainide and mexiletine.  She did not have an effect with flecainide and had too many side effects on mexiletine, though the dose of mexiletine that she was on did suppress her PVCs.  She has plans for ablation upcoming.  She would like to try a lower dose of mexiletine to see if this Jaclyn Murray work prior to ablation.    Past Medical History:  Diagnosis Date   Depression    Dyspareunia    "dryness"   Gluten free diet    Hashimoto's disease    per pt dx 2016 approx.  , unable to tolerate oral thyroid medication, followed by pcp   Hypertension    PMB (postmenopausal bleeding)    Submucous uterine fibroid    Wears glasses    Past Surgical History:  Procedure Laterality Date   COLONOSCOPY WITH PROPOFOL  05/2016   CYSTOSCOPY N/A 02/07/2017   Procedure: CYSTOSCOPY;  Surgeon: Nunzio Cobbs, MD;  Location: [facility];  Service: Gynecology;  Laterality:  N/A;   DILATATION & CURETTAGE/HYSTEROSCOPY WITH MYOSURE N/A 02/07/2017   Procedure: DILATATION & CURETTAGE/HYSTEROSCOPY WITH   RESECTOSCOPE OF SUBMUCOSAL FIBROID, SUTURING OF CERVICAL LACERATION;  Surgeon: Nunzio Cobbs, MD;  Location: [facility];  Service: Gynecology;  Laterality: N/A;   TONSILLECTOMY AND ADENOIDECTOMY  age 58     Current Outpatient Medications  Medication Sig Dispense Refill   ALPRAZolam (XANAX) 0.25 MG tablet TAKE 1 TO 2 TABLETS BY MOUTH DAILY AS NEEDED     Bioflavonoid Products (ESTER-C/BIOFLAVONOIDS PO) Take by mouth. 619 mg daily     Cholecalciferol (VITAMIN D) 125 MCG (5000 UT) CAPS Take 2,000 Units by mouth 2 (two) times daily.     hydrochlorothiazide (MICROZIDE) 12.5 MG capsule Take 12.5-25 mg by mouth as needed.     losartan (COZAAR) 100 MG tablet Take 100 mg by mouth daily.     Magnesium 300 MG CAPS Take by mouth daily.     Melatonin 5 MG CAPS Take 5 mg by mouth daily.     mexiletine (MEXITIL) 150 MG capsule Take 1 capsule (150 mg total) by mouth 2 (two) times daily. 60 capsule 3   Milk Thistle-Turmeric (SILYMARIN PO) Take by mouth. 250 MG     Multiple Vitamin (MULTIVITAMIN) tablet Take 2 tablets by mouth daily.     Nutritional Supplements (ANTIOXIDANTS PO)      potassium chloride (KLOR-CON) 10 MEQ tablet Take 10 mEq by mouth daily.     No current facility-administered  medications for this visit.    Allergies:   Ace inhibitors, Ciprofloxacin, and Erythromycin   Social History:  The patient  reports that she has never smoked. She has never used smokeless tobacco. She reports current alcohol use of about 1.0 standard drink of alcohol per week. She reports that she does not use drugs.   Family History:  The patient's family history includes Cancer (age of onset: 75) in her maternal grandmother; Cancer (age of onset: 31) in her paternal grandfather; Dementia in her mother; Diabetes in her sister and sister; Hypertension in her maternal  grandfather, mother, sister, and sister; Parkinson's disease in her father; Stroke in her maternal grandmother; Thyroid disease in her maternal grandmother and sister.   ROS:  Please see the history of present illness.   Otherwise, review of systems is positive for none.   All other systems are reviewed and negative.   PHYSICAL EXAM: VS:  BP 136/82   Pulse 65   Ht '5\' 4"'$  (1.626 m)   Wt 189 lb 3.2 oz (85.8 kg)   LMP 08/08/2014 (Approximate)   SpO2 98%   BMI 32.48 kg/m  , BMI Body mass index is 32.48 kg/m. GEN: Well nourished, well developed, in no acute distress  HEENT: normal  Neck: no JVD, carotid bruits, or masses Cardiac: RRR; no murmurs, rubs, or gallops,no edema  Respiratory:  clear to auscultation bilaterally, normal work of breathing GI: soft, nontender, nondistended, + BS MS: no deformity or atrophy  Skin: warm and dry Neuro:  Strength and sensation are intact Psych: euthymic mood, full affect  EKG:  EKG is ordered today. Personal review of the ekg ordered shows sinus rhythm   Recent Labs: No results found for requested labs within last 365 days.    Lipid Panel  No results found for: "CHOL", "TRIG", "HDL", "CHOLHDL", "VLDL", "LDLCALC", "LDLDIRECT"   Wt Readings from Last 3 Encounters:  09/08/22 189 lb 3.2 oz (85.8 kg)  08/24/22 191 lb (86.6 kg)  06/06/22 196 lb 3.2 oz (89 kg)      Other studies Reviewed: Additional studies/ records that were reviewed today include: Cardiac monitor 04/08/2022 personally reviewed Review of the above records today demonstrates:  Dominant rhythm: Sinus. HR 47-108 bpm. Avg HR 60 bpm, in sinus rhythm. 3 episodes of atrial tachycardia, fastest at 133 bpm for 7 beats, longest for 9 beats at 107 bpm. <1% isolated SVE, couplets. 0 episodes of VT. 18% isolated VE, <1% couplets. No atrial fibrillation/atrial flutter/VT/high grade AV block, sinus pause >3sec noted. 12 patient triggered events, correlating with VE.  TTE 06/14/2021 Left  ventricle cavity is normal in size and wall thickness. Normal global  wall motion. Normal LV systolic function with visual EF 67%. Doppler  evidence of grade I (impaired) diastolic dysfunction, normal LAP. No  significant valvular abnormality.  No evidence of pulmonary hypertension.   ASSESSMENT AND PLAN:  1.  PVCs: 18% burden on cardiac monitor.  She has tried both flecainide and mexiletine without effect or with side effects.  She would like to try a lower dose of mexiletine.  If this does not work, we Hooria Gasparini plan for ablation.  Risk and benefits were discussed.  Include bleeding, tamponade, heart block, stroke, among others.  She understands these risks and is agreed to the procedure.  2.  Hypertension: Currently well-controlled  3.  High risk medication monitoring: Currently on flecainide for PVCs.  QRS remains narrow.   Current medicines are reviewed at length with the patient today.  The patient does not have concerns regarding her medicines.  The following changes were made today: None  Labs/ tests ordered today include:  Orders Placed This Encounter  Procedures   EKG 12-Lead     Disposition:   FU 3 months  Signed, Miosotis Wetsel Meredith Leeds, MD  09/08/2022 2:43 PM     [place] [address] [place] [address] [phone] (office) [phone] (fax)

## 2022-09-09 ENCOUNTER — Ambulatory Visit
Admission: RE | Admit: 2022-09-09 | Discharge: 2022-09-09 | Disposition: A | Discharge status: Home / Self Care | Payer: BC Managed Care – PPO | Source: Ambulatory Visit

## 2022-09-09 DIAGNOSIS — Z1231 Encounter for screening mammogram for malignant neoplasm of breast: Secondary | ICD-10-CM

## 2022-09-23 ENCOUNTER — Telehealth: Payer: Self-pay | Admitting: Cardiology

## 2022-09-23 NOTE — Telephone Encounter (Signed)
Pt c/o medication issue:  1. Name of Medication:  mexiletine (MEXITIL) 150 MG capsule  2. How are you currently taking this medication (dosage and times per day)?  As prescribed   3. Are you having a reaction (difficulty breathing--STAT)?   4. What is your medication issue?   Patient states this medication is not helping to resolve her issues and she plans to stop taking it. She is requesting to speak with Venida Jarvis, RN if possible, and will discuss further when she talks to her.

## 2022-09-26 ENCOUNTER — Encounter: Payer: Self-pay | Admitting: Cardiology

## 2022-09-29 NOTE — Telephone Encounter (Signed)
Pt aware ok to stop Mexiletine per Camnitz. Pt already knew there were not really other options, medication wise. She will keep [place] informed if she has issues with her PVCs prior to ablation in April.  Aware I will discuss anesthesia further w/ MD and follow up later. Also aware that will be calling soon to go over ablation instructions. Patient verbalized understanding and agreeable to plan.

## 2022-09-29 NOTE — Telephone Encounter (Signed)
Spoke to pt. See my chart message for more information

## 2022-10-05 NOTE — Telephone Encounter (Signed)
Pt aware MAC anesthesia, per Camnitz. Pt appreciates my callback letting her know.

## 2022-10-20 ENCOUNTER — Encounter: Payer: Self-pay | Admitting: *Deleted

## 2022-10-20 ENCOUNTER — Telehealth: Payer: Self-pay | Admitting: *Deleted

## 2022-10-20 DIAGNOSIS — I493 Ventricular premature depolarization: Secondary | ICD-10-CM

## 2022-10-20 DIAGNOSIS — Z01812 Encounter for preprocedural laboratory examination: Secondary | ICD-10-CM

## 2022-10-20 NOTE — Telephone Encounter (Signed)
Reviewed upcoming PVC ablation instructions with pt. Aware sending procedure  instructions via mchart. She will stop by the office on 4/8 for non-fasting pre procedure blood work. Patient verbalized understanding and agreeable to plan.    She does report a recent increase is PVCs. She does NOT want to do anything currently as she is close to ablation. She will office if she would like to readdress and/or worsens.

## 2022-11-14 ENCOUNTER — Ambulatory Visit: Payer: BC Managed Care – PPO | Attending: Cardiology

## 2022-11-14 DIAGNOSIS — I493 Ventricular premature depolarization: Secondary | ICD-10-CM

## 2022-11-14 DIAGNOSIS — Z01812 Encounter for preprocedural laboratory examination: Secondary | ICD-10-CM

## 2022-11-15 LAB — CBC
Hematocrit: 35.2 % (ref 34.0–46.6)
Hemoglobin: 11.7 g/dL (ref 11.1–15.9)
MCH: 29.7 pg (ref 26.6–33.0)
MCHC: 33.2 g/dL (ref 31.5–35.7)
MCV: 89 fL (ref 79–97)
Platelets: 260 10*3/uL (ref 150–450)
RBC: 3.94 x10E6/uL (ref 3.77–5.28)
RDW: 12.6 % (ref 11.7–15.4)
WBC: 5.3 10*3/uL (ref 3.4–10.8)

## 2022-11-15 LAB — BASIC METABOLIC PANEL
BUN/Creatinine Ratio: 16 (ref 9–23)
BUN: 13 mg/dL (ref 6–24)
CO2: 23 mmol/L (ref 20–29)
Calcium: 8.9 mg/dL (ref 8.7–10.2)
Chloride: 104 mmol/L (ref 96–106)
Creatinine, Ser: 0.83 mg/dL (ref 0.57–1.00)
Glucose: 127 mg/dL — ABNORMAL HIGH (ref 70–99)
Potassium: 3.8 mmol/L (ref 3.5–5.2)
Sodium: 142 mmol/L (ref 134–144)
eGFR: 82 mL/min/{1.73_m2} (ref >59)

## 2022-11-22 NOTE — Anesthesia Preprocedure Evaluation (Addendum)
Anesthesia Evaluation  Patient identified by MRN, date of birth, ID band Patient awake    Reviewed: Allergy & Precautions, NPO status , Patient's Chart, lab work & pertinent test results  History of Anesthesia Complications Negative for: history of anesthetic complications  Airway Mallampati: II  TM Distance: >3 FB Neck ROM: Full    Dental  (+) Dental Advisory Given   Pulmonary neg pulmonary ROS   breath sounds clear to auscultation       Cardiovascular hypertension, Pt. on medications (-) angina + dysrhythmias (PVCs)  Rhythm:Regular Rate:Normal  '22 ECHO:  Left ventricle cavity is normal in size and wall thickness. Normal global wall motion. Normal LV systolic function with visual EF 67%. grade I (impaired) diastolic dysfunction, normal LAP. No significant valvular abnormality. No evidence of pulmonary hypertension     Neuro/Psych    Depression    negative neurological ROS     GI/Hepatic Neg liver ROS,GERD  Medicated and Controlled,,  Endo/Other  BMI 31.8  Renal/GU negative Renal ROS     Musculoskeletal   Abdominal   Peds  Hematology negative hematology ROS (+)   Anesthesia Other Findings   Reproductive/Obstetrics                             Anesthesia Physical Anesthesia Plan  ASA: 3  Anesthesia Plan: MAC   Post-op Pain Management: Tylenol PO (pre-op)*   Induction:   PONV Risk Score and Plan: 2 and Ondansetron and Dexamethasone  Airway Management Planned: Natural Airway and Simple Face Mask  Additional Equipment: None  Intra-op Plan:   Post-operative Plan:   Informed Consent: I have reviewed the patients History and Physical, chart, labs and discussed the procedure including the risks, benefits and alternatives for the proposed anesthesia with the patient or authorized representative who has indicated his/her understanding and acceptance.     Dental advisory  given  Plan Discussed with: CRNA and Surgeon  Anesthesia Plan Comments:         Anesthesia Quick Evaluation

## 2022-11-22 NOTE — Pre-Procedure Instructions (Signed)
Instructed patient on the following items: Arrival time 6:15 Nothing to eat or drink after midnight No meds AM of procedure Responsible person to drive you home and stay with you for 24 hrs

## 2022-11-23 ENCOUNTER — Other Ambulatory Visit: Payer: Self-pay

## 2022-11-23 ENCOUNTER — Ambulatory Visit (HOSPITAL_COMMUNITY)
Admission: RE | Admit: 2022-11-23 | Discharge: 2022-11-23 | Disposition: A | Discharge status: Home / Self Care | Payer: BC Managed Care – PPO | Attending: Cardiology | Admitting: Cardiology

## 2022-11-23 ENCOUNTER — Encounter (HOSPITAL_COMMUNITY): Payer: Self-pay | Admitting: Cardiology

## 2022-11-23 ENCOUNTER — Other Ambulatory Visit: Payer: Self-pay | Admitting: Physician Assistant

## 2022-11-23 ENCOUNTER — Ambulatory Visit (HOSPITAL_COMMUNITY): Payer: BC Managed Care – PPO | Admitting: Anesthesiology

## 2022-11-23 ENCOUNTER — Ambulatory Visit (HOSPITAL_COMMUNITY)
Admission: RE | Disposition: A | Discharge status: Home / Self Care | Payer: Self-pay | Source: Home / Self Care | Attending: Cardiology

## 2022-11-23 ENCOUNTER — Ambulatory Visit (INDEPENDENT_AMBULATORY_CARE_PROVIDER_SITE_OTHER): Payer: BC Managed Care – PPO

## 2022-11-23 DIAGNOSIS — I493 Ventricular premature depolarization: Secondary | ICD-10-CM | POA: Insufficient documentation

## 2022-11-23 DIAGNOSIS — I10 Essential (primary) hypertension: Secondary | ICD-10-CM | POA: Insufficient documentation

## 2022-11-23 DIAGNOSIS — E063 Autoimmune thyroiditis: Secondary | ICD-10-CM | POA: Diagnosis not present

## 2022-11-23 HISTORY — PX: PVC ABLATION: EP1236

## 2022-11-23 SURGERY — PVC ABLATION
Anesthesia: Monitor Anesthesia Care

## 2022-11-23 MED ORDER — SODIUM CHLORIDE 0.9 % IV SOLN: INTRAVENOUS | Status: DC

## 2022-11-23 MED ORDER — ACETAMINOPHEN 500 MG PO TABS
1000.0000 mg | ORAL_TABLET | Freq: Once | ORAL | Status: AC
  Administered 2022-11-23: 1000 mg via ORAL
  Filled 2022-11-23: qty 2

## 2022-11-23 MED ORDER — MIDAZOLAM HCL 2 MG/2ML IJ SOLN
INTRAMUSCULAR | Status: DC | PRN
  Administered 2022-11-23: 2 mg via INTRAVENOUS

## 2022-11-23 MED ORDER — HEPARIN (PORCINE) IN NACL 1000-0.9 UT/500ML-% IV SOLN
INTRAVENOUS | Status: DC | PRN
  Administered 2022-11-23: 500 mL

## 2022-11-23 MED ORDER — HEPARIN SODIUM (PORCINE) 1000 UNIT/ML IJ SOLN
INTRAMUSCULAR | Status: AC
  Filled 2022-11-23: qty 10

## 2022-11-23 MED ORDER — PROPOFOL 500 MG/50ML IV EMUL
INTRAVENOUS | Status: DC | PRN
  Administered 2022-11-23: 50 ug/kg/min via INTRAVENOUS

## 2022-11-23 MED ORDER — ISOPROTERENOL HCL 0.2 MG/ML IJ SOLN
INTRAVENOUS | Status: DC | PRN
  Administered 2022-11-23: 2 ug/min via INTRAVENOUS

## 2022-11-23 MED ORDER — LIDOCAINE HCL (PF) 1 % IJ SOLN
INTRAMUSCULAR | Status: AC
  Filled 2022-11-23: qty 60

## 2022-11-23 MED ORDER — ISOPROTERENOL HCL 0.2 MG/ML IJ SOLN
INTRAMUSCULAR | Status: AC
  Filled 2022-11-23: qty 5

## 2022-11-23 MED ORDER — FENTANYL CITRATE (PF) 100 MCG/2ML IJ SOLN
INTRAMUSCULAR | Status: DC | PRN
  Administered 2022-11-23: 100 ug via INTRAVENOUS

## 2022-11-23 SURGICAL SUPPLY — 9 items
CATH SOUNDSTAR ECO 8FR (CATHETERS) IMPLANT
CLOSURE PERCLOSE PROSTYLE (VASCULAR PRODUCTS) IMPLANT
PACK EP LATEX FREE (CUSTOM PROCEDURE TRAY) ×1
PACK EP LF (CUSTOM PROCEDURE TRAY) ×1 IMPLANT
PAD DEFIB RADIO PHYSIO CONN (PAD) ×1 IMPLANT
PATCH CARTO3 (PAD) IMPLANT
SHEATH PINNACLE 7F 10CM (SHEATH) IMPLANT
SHEATH PINNACLE 8F 10CM (SHEATH) IMPLANT
SHEATH PINNACLE 9F 10CM (SHEATH) IMPLANT

## 2022-11-23 NOTE — Progress Notes (Unsigned)
Enrolled for [employer] to mail a ZIO XT long term holter monitor to the patients address on file.   Dr. Camnitz to read. 

## 2022-11-23 NOTE — Transfer of Care (Signed)
Immediate Anesthesia Transfer of Care Note  Patient: Jaclyn Murray  Procedure(s) Performed: PVC ABLATION  Patient Location: PACU  Anesthesia Type:MAC  Level of Consciousness: awake, alert , and oriented  Airway & Oxygen Therapy: Patient Spontanous Breathing and Patient connected to face mask oxygen  Post-op Assessment: Report given to RN, Post -op Vital signs reviewed and stable, and Patient moving all extremities  Post vital signs: Reviewed and stable  Last Vitals:  Vitals Value Taken Time  BP    Temp 36.7 C 11/23/22 0927  Pulse 66 11/23/22 0930  Resp 15 11/23/22 0930  SpO2 100 % 11/23/22 0930  Vitals shown include unvalidated device data.  Last Pain:  Vitals:   11/23/22 0927  TempSrc: Tympanic  PainSc: 0-No pain      Patients Stated Pain Goal: 4 (11/23/22 0816)  Complications: There were no known notable events for this encounter.

## 2022-11-23 NOTE — Anesthesia Postprocedure Evaluation (Signed)
Anesthesia Post Note  Patient: Jaclyn Murray  Procedure(s) Performed: PVC ABLATION     Patient location during evaluation: Cath Lab Anesthesia Type: MAC Level of consciousness: awake and alert, patient cooperative and oriented Pain management: pain level controlled Vital Signs Assessment: post-procedure vital signs reviewed and stable Respiratory status: nonlabored ventilation, spontaneous breathing and respiratory function stable Cardiovascular status: blood pressure returned to baseline and stable Postop Assessment: no apparent nausea or vomiting Anesthetic complications: no   There were no known notable events for this encounter.  Last Vitals:  Vitals:   11/23/22 0717 11/23/22 0927  BP: (!) 143/79   Pulse: 60 (!) 59  Resp: 17 (!) 9  Temp: 36.5 C 36.7 C  SpO2: 97% 98%    Last Pain:  Vitals:   11/23/22 0927  TempSrc: Tympanic  PainSc: 0-No pain                 Raksha Wolfgang,E. Kleigh Hoelzer

## 2022-11-23 NOTE — H&P (Signed)
Electrophysiology Office Note   Date:  11/23/2022   ID:  Jaclyn Murray, Jaclyn Murray 1965-11-13, MRN [id number]  PCP:  Andi Devon, MD  Cardiologist:  Rosemary Holms Primary Electrophysiologist:  Michaeal Davis Jorja Loa, MD    Chief Complaint: PVC   History of Present Illness: Jaclyn Murray is a 57 y.o. female who is being seen today for the evaluation of PVC at the request of No ref. provider found. Presenting today for electrophysiology evaluation.  She has a history significant for Hashimoto's thyroiditis, hypertension, PVCs.  She wore a cardiac monitor that showed an 18% PVC burden.  She felt poor on metoprolol.  She has weakness, fatigue, shortness of breath due to her PVCs.  She has since been started on flecainide.  Today, denies symptoms of palpitations, chest pain, shortness of breath, orthopnea, PND, lower extremity edema, claudication, dizziness, presyncope, syncope, bleeding, or neurologic sequela. The patient is tolerating medications without difficulties. Plan for PVC ablation today.     Past Medical History:  Diagnosis Date   Depression    Dyspareunia    "dryness"   Gluten free diet    Hashimoto's disease    per pt dx 2016 approx.  , unable to tolerate oral thyroid medication, followed by pcp   Hypertension    PMB (postmenopausal bleeding)    Submucous uterine fibroid    Wears glasses    Past Surgical History:  Procedure Laterality Date   COLONOSCOPY WITH PROPOFOL  05/2016   CYSTOSCOPY N/A 02/07/2017   Procedure: CYSTOSCOPY;  Surgeon: Patton Salles, MD;  Location: [facility];  Service: Gynecology;  Laterality: N/A;   DILATATION & CURETTAGE/HYSTEROSCOPY WITH MYOSURE N/A 02/07/2017   Procedure: DILATATION & CURETTAGE/HYSTEROSCOPY WITH   RESECTOSCOPE OF SUBMUCOSAL FIBROID, SUTURING OF CERVICAL LACERATION;  Surgeon: Patton Salles, MD;  Location[facility];  Service: Gynecology;  Laterality: N/A;    TONSILLECTOMY AND ADENOIDECTOMY  age 77     No current facility-administered medications for this encounter.    Allergies:   Ace inhibitors, Ciprofloxacin, and Erythromycin   Social History:  The patient  reports that she has never smoked. She has never used smokeless tobacco. She reports current alcohol use of about 1.0 standard drink of alcohol per week. She reports that she does not use drugs.   Family History:  The patient's family history includes Cancer (age of onset: 23) in her maternal grandmother; Cancer (age of onset: 15) in her paternal grandfather; Dementia in her mother; Diabetes in her sister and sister; Hypertension in her maternal grandfather, mother, sister, and sister; Parkinson's disease in her father; Stroke in her maternal grandmother; Thyroid disease in her maternal grandmother and sister.   ROS:  Please see the history of present illness.   Otherwise, review of systems is positive for none.   All other systems are reviewed and negative.   PHYSICAL EXAM: VS:  BP (!) 143/79   Pulse 60   Temp 97.7 F (36.5 C) (Oral)   Resp 17   Ht  (1.626 m)   Wt 83.9 kg   LMP 08/08/2014 (Approximate)   SpO2 97%   BMI 31.76 kg/m  , BMI Body mass index is 31.76 kg/m. GEN: Well nourished, well developed, in no acute distress  HEENT: normal  Neck: no JVD, carotid bruits, or masses Cardiac: RRR; no murmurs, rubs, or gallops,no edema  Respiratory:  clear to auscultation bilaterally, normal work of breathing GI: soft, nontender, nondistended, + BS  MS: no deformity or atrophy  Skin: warm and dry Neuro:  Strength and sensation are intact Psych: euthymic mood, full affect  Recent Labs: 11/14/2022: BUN 13; Creatinine, Ser 0.83; Hemoglobin 11.7; Platelets 260; Potassium 3.8; Sodium 142    Lipid Panel  No results found for: "CHOL", "TRIG", "HDL", "CHOLHDL", "VLDL", "LDLCALC", "LDLDIRECT"   Wt Readings from Last 3 Encounters:  11/23/22 83.9 kg  09/08/22 85.8 kg  08/24/22  86.6 kg      Other studies Reviewed: Additional studies/ records that were reviewed today include: Cardiac monitor 04/08/2022 personally reviewed Review of the above records today demonstrates:  Dominant rhythm: Sinus. HR 47-108 bpm. Avg HR 60 bpm, in sinus rhythm. 3 episodes of atrial tachycardia, fastest at 133 bpm for 7 beats, longest for 9 beats at 107 bpm. <1% isolated SVE, couplets. 0 episodes of VT. 18% isolated VE, <1% couplets. No atrial fibrillation/atrial flutter/VT/high grade AV block, sinus pause >3sec noted. 12 patient triggered events, correlating with VE.  TTE 06/14/2021 Left ventricle cavity is normal in size and wall thickness. Normal global  wall motion. Normal LV systolic function with visual EF 67%. Doppler  evidence of grade I (impaired) diastolic dysfunction, normal LAP. No  significant valvular abnormality.  No evidence of pulmonary hypertension.   ASSESSMENT AND PLAN:  1.  PVCs: Jaclyn Murray has presented today for surgery, with the diagnosis of PVC.  The various methods of treatment have been discussed with the patient and family. After consideration of risks, benefits and other options for treatment, the patient has consented to  Procedure(s): Catheter ablation as a surgical intervention .  Risks include but not limited to complete heart block, stroke, esophageal damage, nerve damage, bleeding, vascular damage, tamponade, perforation, MI, and death. The patient's history has been reviewed, patient examined, no change in status, stable for surgery.  I have reviewed the patient's chart and labs.  Questions were answered to the patient's satisfaction.    Jaclyn Retana Elberta Fortis, MD 11/23/2022 7:24 AM

## 2022-11-24 MED FILL — Lidocaine HCl Local Preservative Free (PF) Inj 1%: INTRAMUSCULAR | Qty: 30 | Status: AC

## 2022-11-24 MED FILL — Heparin Sodium (Porcine) Inj 1000 Unit/ML: INTRAMUSCULAR | Qty: 10 | Status: AC

## 2022-11-26 ENCOUNTER — Encounter: Payer: Self-pay | Admitting: Cardiology

## 2022-11-26 DIAGNOSIS — I493 Ventricular premature depolarization: Secondary | ICD-10-CM

## 2022-12-07 ENCOUNTER — Encounter (HOSPITAL_COMMUNITY): Payer: Self-pay | Admitting: Cardiology

## 2022-12-26 ENCOUNTER — Ambulatory Visit: Payer: BC Managed Care – PPO | Attending: Cardiology | Admitting: Cardiology

## 2022-12-26 ENCOUNTER — Encounter: Payer: Self-pay | Admitting: Cardiology

## 2022-12-26 VITALS — BP 140/66 | HR 87 | Ht 64.0 in | Wt 193.0 lb

## 2022-12-26 DIAGNOSIS — I10 Essential (primary) hypertension: Secondary | ICD-10-CM

## 2022-12-26 DIAGNOSIS — I493 Ventricular premature depolarization: Secondary | ICD-10-CM

## 2022-12-26 MED ORDER — PROPAFENONE HCL ER 325 MG PO CP12: 325.0000 mg | ORAL_CAPSULE | Freq: Two times a day (BID) | ORAL | 3 refills | Status: DC

## 2022-12-26 NOTE — Patient Instructions (Addendum)
Medication Instructions:  Your physician has recommended you make the following change in your medication:  START Rythmol (Propafenone) 325 mg twice daily  Please speak with your primary physician about low dose of SNRIs (Cymbalta, Effexor, Pristique).  These would be what you could take on Rythmol (Propafenone).  You canNOT take SSRIs  *If you need a refill on your cardiac medications before your next appointment, please call your pharmacy*   Lab Work: None ordered If you have labs (blood work) drawn today and your tests are completely normal, you will receive your results only by: MyChart Message (if you have MyChart) OR A paper copy in the mail If you have any lab test that is abnormal or we need to change your treatment, we will call you to review the results.   Testing/Procedures: None ordered   Follow-Up: At [facility], you and your health needs are our priority.  As part of our continuing mission to provide you with exceptional heart care, we have created designated Provider Care Teams.  These Care Teams include your primary Cardiologist (physician) and Advanced Practice Providers (APPs -  Physician Assistants and Nurse Practitioners) who all work together to provide you with the care you need, when you need it.  Your next appointment:   To   be determined  The format for your next appointment:   In Person  Provider:   Loman Brooklyn, MD{    Thank you for choosing [facility]!!   Dory Horn, RN [phone]  Other Instructions   Propafenone Extended-Release Capsules What is this medication? PROPAFENONE (proe pa FEEN one) prevents and treats a fast or irregular heartbeat (arrhythmia). It is often used to treat a type of arrhythmia known as AFib (atrial fibrillation). It works by slowing down overactive electric signals in the heart, which stabilizes your heart rhythm. It belongs to a group of medications called antiarrhythmics. This medicine may be used  for other purposes; ask your health care provider or pharmacist if you have questions. COMMON BRAND NAME(S): Rythmol SR What should I tell my care team before I take this medication? They need to know if you have any of these conditions: Heart disease High potassium level Kidney disease Liver disease Low blood pressure Lung or breathing disease like asthma, chronic bronchitis, or emphysema Pacemaker Slow heart rate An unusual or allergic reaction to propafenone, other medications, foods, dyes, or preservatives Pregnant or trying to get pregnant Breast-feeding How should I use this medication? Take this medication by mouth with a glass of water. Follow the directions on the prescription label. Swallow whole. Do not crush or chew. You can take this medication with or without food. Take your doses at regular intervals. Do not take your medication more often than directed. Talk to your care team regarding the use of this medication in children. Special care may be needed. Overdosage: If you think you have taken too much of this medicine contact a poison control center or emergency room at once. NOTE: This medicine is only for you. Do not share this medicine with others. What if I miss a dose? If you miss a dose, take it as soon as you can. If it is almost time for your next dose, take only that dose. Do not take double or extra doses. What may interact with this medication? Do not take this medication with any of the following: Arsenic trioxide Certain antibiotics like clarithromycin, erythromycin, grepafloxacin, pentamidine, sparfloxacin, troleandomycin Certain medications for depression or mental illness like amoxapine,  haloperidol, maprotiline, pimozide, sertindole, thioridazine, tricyclic antidepressants Certain medications for fungal infections like fluconazole, itraconazole, ketoconazole, posaconazole, voriconazole Certain medications for irregular heartbeat like dronedarone Certain  medications for malaria like chloroquine, halofantrine Cisapride Droperidol Levomethadyl Ranolazine This medication may also interact with the following: Certain medications for angina or blood pressure Certain medications for asthma or breathing difficulties like formoterol, salmeterol Certain medications that treat or prevent blood clots like warfarin Cimetidine Cyclosporine Digoxin Diuretics Local anesthetics Other medications that prolong the QT interval (cause an abnormal heart rhythm) like dofetilide, ziprasidone Rifampin Ritonavir Theophylline This list may not describe all possible interactions. Give your health care provider a list of all the medicines, herbs, non-prescription drugs, or dietary supplements you use. Also tell them if you smoke, drink alcohol, or use illegal drugs. Some items may interact with your medicine. What should I watch for while using this medication? Your condition will be monitored closely when you first begin therapy. Often, this medication is first started in a hospital or other monitored health care setting. Once you are on maintenance therapy, visit your care team for regular checks on your progress. Because your condition and use of this medication carry some risk, it is a good idea to carry an identification card, necklace or bracelet with details of your condition, medications, and care team. You may get drowsy or dizzy. Do not drive, use machinery, or do anything that needs mental alertness until you know how this medication affects you. Do not stand or sit up quickly, especially if you are an older patient. This reduces the risk of dizzy or fainting spells. If you are going to have surgery, tell your care team that you are taking this medication. What side effects may I notice from receiving this medication? Side effects that you should report to your care team as soon as possible: Allergic reactions--skin rash, itching, hives, swelling of the  face, lips, tongue, or throat Heart failure--shortness of breath, swelling of the ankles, feet, or hands, sudden weight gain, unusual weakness or fatigue Heart rhythm changes--fast or irregular heartbeat, dizziness, feeling faint or lightheaded, chest pain, trouble breathing Infection--fever, chills, cough, sore throat Unusual bruising or bleeding Side effects that usually do not require medical attention (report to your care team if they continue or are bothersome): Change in taste Constipation Dizziness Fatigue Nausea This list may not describe all possible side effects. Call your doctor for medical advice about side effects. You may report side effects to FDA at 1-800-FDA-1088. Where should I keep my medication? Keep out of the reach of children and pets. Store at room temperature between 15 and 30 degrees C (59 and 86 degrees F). Keep container tightly closed. Throw away any unused medication after the expiration date. NOTE: This sheet is a summary. It may not cover all possible information. If you have questions about this medicine, talk to your doctor, pharmacist, or health care provider.  2023 Elsevier/Gold Standard (2005-04-27 00:00:00)

## 2022-12-26 NOTE — Progress Notes (Signed)
Electrophysiology Office Note   Date:  12/26/2022   ID:  Jaclyn Murray, Jaclyn Murray July 21, 1966, MRN [id number]  PCP:  Andi Devon, MD  Cardiologist:  Rosemary Holms Primary Electrophysiologist:  Kirin Pastorino Jorja Loa, MD    Chief Complaint: PVC   History of Present Illness: Jaclyn Murray is a 57 y.o. female who is being seen today for the evaluation of PVC at the request of Andi Devon, MD. Presenting today for electrophysiology evaluation.  She has a history of Hashimoto's thyroiditis, hypertension, PVCs.  She wore a cardiac monitor that showed an 18% PVC burden.  She felt poorly on metoprolol.  She was started on both mexiletine and flecainide without improvement.  She had an attempted ablation.  She had no PVCs at the start of the procedure.  She was started on isoproterenol with only a mild increase in her PVCs and with multiple PVC morphologies.  Ablation was not performed.  Today, denies symptoms of palpitations, chest pain, shortness of breath, orthopnea, PND, lower extremity edema, claudication, dizziness, presyncope, syncope, bleeding, or neurologic sequela. The patient is tolerating medications without difficulties.  Since her ablation attempt she has continued to have intermittent PVCs.  She wore a cardiac monitor with a 9% burden.  She feels that her PVCs have worsened more since then.  She feels weak and fatigued due to these.   Past Medical History:  Diagnosis Date   Depression    Dyspareunia    "dryness"   Gluten free diet    Hashimoto's disease    per pt dx 2016 approx.  , unable to tolerate oral thyroid medication, followed by pcp   Hypertension    PMB (postmenopausal bleeding)    Submucous uterine fibroid    Wears glasses    Past Surgical History:  Procedure Laterality Date   COLONOSCOPY WITH PROPOFOL  05/2016   CYSTOSCOPY N/A 02/07/2017   Procedure: CYSTOSCOPY;  Surgeon: Patton Salles, MD;  Location: [facility];   Service: Gynecology;  Laterality: N/A;   DILATATION & CURETTAGE/HYSTEROSCOPY WITH MYOSURE N/A 02/07/2017   Procedure: DILATATION & CURETTAGE/HYSTEROSCOPY WITH   RESECTOSCOPE OF SUBMUCOSAL FIBROID, SUTURING OF CERVICAL LACERATION;  Surgeon: Patton Salles, MD;  Location: [facility];  Service: Gynecology;  Laterality: N/A;   PVC ABLATION N/A 11/23/2022   Procedure: PVC ABLATION;  Surgeon: Regan Lemming, MD;  Location: [facility];  Service: Cardiovascular;  Laterality: N/A;   TONSILLECTOMY AND ADENOIDECTOMY  age 57     Current Outpatient Medications  Medication Sig Dispense Refill   acetaminophen (TYLENOL) 650 MG CR tablet Take 1,300 mg by mouth every 8 (eight) hours as needed for pain.     ALPRAZolam (XANAX) 0.25 MG tablet Take 0.25 mg by mouth daily as needed for anxiety.     ascorbic acid (VITAMIN C) 500 MG tablet Take 500 mg by mouth daily.     Barberry-Oreg Grape-Goldenseal (BERBERINE COMPLEX PO) Take by mouth in the morning and at bedtime.     Cholecalciferol (VITAMIN D) 50 MCG (2000 UT) CAPS Take 4,000 Units by mouth daily.     famotidine (PEPCID) 20 MG tablet Take 20 mg by mouth at bedtime.     Glutathione 500 MG CAPS Take by mouth.     hydrochlorothiazide (MICROZIDE) 12.5 MG capsule Take 12.5-25 mg by mouth as needed (swelling).     losartan (COZAAR) 100 MG tablet Take 100 mg by mouth at bedtime.     MAGNESIUM  PO Take 240 mg by mouth daily.     Melatonin 5 MG SUBL Place 5 mg under the tongue at bedtime.     Milk Thistle-Turmeric (SILYMARIN PO) Take 1 tablet by mouth daily.     Multiple Vitamin (MULTIVITAMIN) tablet Take 2 tablets by mouth daily.     potassium chloride (KLOR-CON) 10 MEQ tablet Take 10 mEq by mouth daily as needed (when taking hctz).     propafenone (RYTHMOL SR) 325 MG 12 hr capsule Take 1 capsule (325 mg total) by mouth 2 (two) times daily. 60 capsule 3   No current facility-administered medications for this visit.     Allergies:   Ace inhibitors, Ciprofloxacin, and Erythromycin   Social History:  The patient  reports that she has never smoked. She has never used smokeless tobacco. She reports current alcohol use of about 1.0 standard drink of alcohol per week. She reports that she does not use drugs.   Family History:  The patient's family history includes Cancer (age of onset: 22) in her maternal grandmother; Cancer (age of onset: 47) in her paternal grandfather; Dementia in her mother; Diabetes in her sister and sister; Hypertension in her maternal grandfather, mother, sister, and sister; Parkinson's disease in her father; Stroke in her maternal grandmother; Thyroid disease in her maternal grandmother and sister.   ROS:  Please see the history of present illness.   Otherwise, review of systems is positive for none.   All other systems are reviewed and negative.   PHYSICAL EXAM: VS:  BP (!) 140/66   Pulse 87   Ht 5\' 4"  (1.626 m)   Wt 193 lb (87.5 kg)   LMP 08/08/2014 (Approximate)   SpO2 97%   BMI 33.13 kg/m  , BMI Body mass index is 33.13 kg/m. GEN: Well nourished, well developed, in no acute distress  HEENT: normal  Neck: no JVD, carotid bruits, or masses Cardiac: RRR; no murmurs, rubs, or gallops,no edema  Respiratory:  clear to auscultation bilaterally, normal work of breathing GI: soft, nontender, nondistended, + BS MS: no deformity or atrophy  Skin: warm and dry Neuro:  Strength and sensation are intact Psych: euthymic mood, full affect  EKG:  EKG is ordered today. Personal review of the ekg ordered shows sinus rhythm, PVCs  Recent Labs: 11/14/2022: BUN 13; Creatinine, Ser 0.83; Hemoglobin 11.7; Platelets 260; Potassium 3.8; Sodium 142    Lipid Panel  No results found for: "CHOL", "TRIG", "HDL", "CHOLHDL", "VLDL", "LDLCALC", "LDLDIRECT"   Wt Readings from Last 3 Encounters:  12/26/22 193 lb (87.5 kg)  11/23/22 185 lb (83.9 kg)  09/08/22 189 lb 3.2 oz (85.8 kg)      Other  studies Reviewed: Additional studies/ records that were reviewed today include: Cardiac monitor 04/08/2022 personally reviewed Review of the above records today demonstrates:  Dominant rhythm: Sinus. HR 47-108 bpm. Avg HR 60 bpm, in sinus rhythm. 3 episodes of atrial tachycardia, fastest at 133 bpm for 7 beats, longest for 9 beats at 107 bpm. <1% isolated SVE, couplets. 0 episodes of VT. 18% isolated VE, <1% couplets. No atrial fibrillation/atrial flutter/VT/high grade AV block, sinus pause >3sec noted. 12 patient triggered events, correlating with VE.  TTE 06/14/2021 Left ventricle cavity is normal in size and wall thickness. Normal global  wall motion. Normal LV systolic function with visual EF 67%. Doppler  evidence of grade I (impaired) diastolic dysfunction, normal LAP. No  significant valvular abnormality.  No evidence of pulmonary hypertension.   ASSESSMENT AND PLAN:  1.  PVCs: 18% burden on cardiac monitor.  Has failed flecainide and mexiletine.  Did for ablation, though she was having PVCs of multiple morphologies.  Repeat monitor showed a 9% burden.  Unfortunately she is continuing to have significant symptoms.  Mexiletine did improve her symptoms but she had side effects.  Flecainide did not improve her symptoms.  Yousuf Ager start 325 mg propafenone.  2.  Hypertension: Mildly elevated but usually well-controlled  Current medicines are reviewed at length with the patient today.   The patient does not have concerns regarding her medicines.  The following changes were made today: Propafenone  Labs/ tests ordered today include:  Orders Placed This Encounter  Procedures   EKG 12-Lead     Disposition:   FU 3 months  Signed, Kolyn Rozario Jorja Loa, MD  12/26/2022 5:23 PM     [facility] [address] [place] [address] [phone] (office) [phone] (fax)

## 2023-02-20 ENCOUNTER — Telehealth: Payer: Self-pay | Admitting: *Deleted

## 2023-02-20 NOTE — Telephone Encounter (Addendum)
Left message to call back to discuss if she ever switched depression medication..... and did she every start the Rythmol? Also need to determine follow up.......    5/20 OV note:  START Rythmol (Propafenone) 325 mg twice daily   Please speak with your primary physician about low dose of SNRIs (Cymbalta, Effexor, Pristique).  These would be what you could take on Rythmol (Propafenone).  You canNOT take SSRIs

## 2023-02-22 ENCOUNTER — Encounter: Payer: Self-pay | Admitting: Cardiology

## 2023-02-23 NOTE — Telephone Encounter (Signed)
Pt reports that she did start the medication, she never started the depression medication. She has been taking Propafenone for almost 2 months now. She does not have the SOB like before. She still experiences some chest pressure when she is having bigeminy/trigeminy. She is having episodes of profound fatigue, like she "just needs to close her eyes".  Occurring intermittently, about every other day. Scheduled pt for follow up next Friday to further discuss w/ Dr. Elberta Fortis. Patient verbalized understanding and agreeable to plan.

## 2023-03-03 ENCOUNTER — Encounter: Payer: Self-pay | Admitting: Cardiology

## 2023-03-03 ENCOUNTER — Ambulatory Visit: Payer: BC Managed Care – PPO | Attending: Cardiology | Admitting: Cardiology

## 2023-03-03 VITALS — BP 130/86 | HR 65 | Ht 64.0 in | Wt 188.8 lb

## 2023-03-03 DIAGNOSIS — I472 Ventricular tachycardia, unspecified: Secondary | ICD-10-CM

## 2023-03-03 DIAGNOSIS — I493 Ventricular premature depolarization: Secondary | ICD-10-CM

## 2023-03-03 NOTE — Patient Instructions (Signed)

## 2023-03-03 NOTE — Progress Notes (Signed)
  Electrophysiology Office Note:   Date:  03/03/2023  ID:  Jaclyn Murray, DOB 1966/01/06, MRN [id number]  Primary Cardiologist: None Electrophysiologist: Tationna Fullard Jorja Loa, MD      History of Present Illness:   Jaclyn Murray is a 57 y.o. female with h/o PVCs seen today for routine electrophysiology followup.  Since last being seen in our clinic the patient reports doing better since her propafenone was started.  She has no chest pain, but does get fatigued with palpitations intermittently.  There are days that she feels completely normal, and other days that she does have palpitations.  She also has episodes where she feels fatigued.  Fatigue lasts for 15 to 30 minutes at a time.  There is no exacerbating or alleviating factors to her fatigue or palpitations.  she denies chest pain, palpitations, dyspnea, PND, orthopnea, nausea, vomiting, dizziness, syncope, edema, weight gain, or early satiety.     She has a history of Hashimoto's thyroiditis, hypertension, PVCs.  She wore a cardiac monitor with an 18% PVC burden.  She was started on mexiletine and flecainide without improvement.  She had attempted ablation but no PVCs occurred during the procedure.  She has now been started on propafenone.     Review of systems complete and found to be negative unless listed in HPI.   EP Information / Studies Reviewed:    EKG is ordered today. Personal review as below.  EKG Interpretation Date/Time:  Friday March 03 2023 13:33:36 EDT Ventricular Rate:  65 PR Interval:  188 QRS Duration:  82 QT Interval:  394 QTC Calculation: 409 R Axis:   -12  Text Interpretation: Normal sinus rhythm Cannot rule out Anterior infarct (cited on or before 20-May-2021) When compared with ECG of 20-May-2021 18:12, No significant change was found Confirmed by Tyshauna Finkbiner ([id number]) on 03/03/2023 1:36:23 PM     Risk Assessment/Calculations:              Physical Exam:   VS:  BP 130/86 (BP Location:  Left Arm, Patient Position: Sitting, Cuff Size: Large)   Pulse 65   Ht 5\' 4"  (1.626 m)   Wt 188 lb 12.8 oz (85.6 kg)   LMP 08/08/2014 (Approximate)   SpO2 96%   BMI 32.41 kg/m    Wt Readings from Last 3 Encounters:  03/03/23 188 lb 12.8 oz (85.6 kg)  12/26/22 193 lb (87.5 kg)  11/23/22 185 lb (83.9 kg)     GEN: Well nourished, well developed in no acute distress NECK: No JVD; No carotid bruits CARDIAC: Regular rate and rhythm, no murmurs, rubs, gallops RESPIRATORY:  Clear to auscultation without rales, wheezing or rhonchi  ABDOMEN: Soft, non-tender, non-distended EXTREMITIES:  No edema; No deformity   ASSESSMENT AND PLAN:    1.  PVCs: 18% burden on cardiac monitor.  Has failed both flecainide and mexiletine.  Had an attempted ablation, though was having multiple PVC morphologies and thus no ablation was performed.  Repeat monitor shows a 9% burden.  Currently on propafenone.  She is continue to have palpitations, though they are improved.  She is also has episodes of significant fatigue.  She Larue Drawdy get a EKG from her watch when she is having fatigue.  May need to increase her propafenone dose.  2.  Hypertension: Currently well-controlled  Follow up with Dr. Elberta Fortis in 6 months  Signed, Adelaida Reindel Jorja Loa, MD

## 2023-04-16 ENCOUNTER — Other Ambulatory Visit: Payer: Self-pay | Admitting: Cardiology

## 2023-04-18 ENCOUNTER — Other Ambulatory Visit: Payer: Self-pay | Admitting: Cardiology

## 2023-08-15 ENCOUNTER — Other Ambulatory Visit: Payer: Self-pay

## 2023-08-15 ENCOUNTER — Encounter: Payer: Self-pay | Admitting: Cardiology

## 2023-08-15 MED ORDER — PROPAFENONE HCL ER 325 MG PO CP12: 325.0000 mg | ORAL_CAPSULE | Freq: Two times a day (BID) | ORAL | 3 refills | Status: DC

## 2023-10-20 ENCOUNTER — Other Ambulatory Visit: Payer: Self-pay | Admitting: Internal Medicine

## 2023-10-20 DIAGNOSIS — Z0000 Encounter for general adult medical examination without abnormal findings: Secondary | ICD-10-CM

## 2023-10-24 ENCOUNTER — Ambulatory Visit
Admission: RE | Admit: 2023-10-24 | Discharge: 2023-10-24 | Disposition: A | Discharge status: Home / Self Care | Source: Ambulatory Visit | Attending: Internal Medicine | Admitting: Internal Medicine

## 2023-10-24 DIAGNOSIS — Z0000 Encounter for general adult medical examination without abnormal findings: Secondary | ICD-10-CM

## 2023-10-25 ENCOUNTER — Encounter: Payer: Self-pay | Admitting: Obstetrics and Gynecology

## 2024-03-27 ENCOUNTER — Other Ambulatory Visit: Payer: Self-pay

## 2024-03-27 ENCOUNTER — Encounter: Payer: Self-pay | Admitting: Cardiology

## 2024-03-27 MED ORDER — PROPAFENONE HCL ER 325 MG PO CP12: 325.0000 mg | ORAL_CAPSULE | Freq: Two times a day (BID) | ORAL | 0 refills | Status: DC

## 2024-05-07 ENCOUNTER — Telehealth: Payer: Self-pay

## 2024-05-07 NOTE — Telephone Encounter (Signed)
 Patient has upcoming visit with Dr. Inocencio.  The cardiac clearance can be addressed at that time.  Her last visit was a year ago.

## 2024-05-07 NOTE — Telephone Encounter (Signed)
   Pre-operative Risk Assessment    Patient Name: Jaclyn Murray  DOB: October 04, 1965 MRN: [id number]   Date of last office visit: 03/03/23 WILL CAMNITZ, MD Date of next office visit: 05/24/24 WILL CAMNITZ, MD   Request for Surgical Clearance    Procedure:  CYST EXCISION  Date of Surgery:  Clearance TBD                                Surgeon:  RICHERD SILVERSMITH, MD Surgeon's Group or Practice Name:  [facility] Phone number:  [phone] Fax number:  [phone]  ATTN: COLETA MOLT, CMA   Type of Clearance Requested:   - Medical    Type of Anesthesia:  LMA   Additional requests/questions:    Signed, Lucie DELENA Ku   05/07/2024, 10:40 AM

## 2024-05-07 NOTE — Telephone Encounter (Signed)
 I will update all parties the pt has appt with Dr. Inocencio 05/24/24. See notes from preop APP Scot Ford, PAC.

## 2024-05-24 ENCOUNTER — Encounter: Payer: Self-pay | Admitting: Cardiology

## 2024-05-24 ENCOUNTER — Ambulatory Visit: Attending: Cardiology | Admitting: Cardiology

## 2024-05-24 VITALS — BP 156/93 | HR 60 | Ht 64.0 in | Wt 192.0 lb

## 2024-05-24 DIAGNOSIS — I472 Ventricular tachycardia, unspecified: Secondary | ICD-10-CM

## 2024-05-24 DIAGNOSIS — I10 Essential (primary) hypertension: Secondary | ICD-10-CM | POA: Diagnosis not present

## 2024-05-24 DIAGNOSIS — I493 Ventricular premature depolarization: Secondary | ICD-10-CM

## 2024-05-24 MED ORDER — PROPAFENONE HCL ER 325 MG PO CP12: 325.0000 mg | ORAL_CAPSULE | Freq: Two times a day (BID) | ORAL | 3 refills | Status: AC

## 2024-05-24 NOTE — Patient Instructions (Signed)
 Medication Instructions:  Your physician recommends that you continue on your current medications as directed. Please refer to the Current Medication list given to you today.  *If you need a refill on your cardiac medications before your next appointment, please call your pharmacy*  Follow-Up: At [facility], you and your health needs are our priority.  As part of our continuing mission to provide you with exceptional heart care, our providers are all part of one team.  This team includes your primary Cardiologist (physician) and Advanced Practice Providers or APPs (Physician Assistants and Nurse Practitioners) who all work together to provide you with the care you need, when you need it.  Your next appointment:   1 year  Provider:   You may see Will Gladis Norton, MD or one of the following Advanced Practice Providers on your designated Care Team:   Charlies Arthur, [place] Ozell Jodie Passey, PA-C Suzann Riddle, NP Daphne Barrack, NP Artist Pouch, PA-C

## 2024-05-24 NOTE — Progress Notes (Signed)
  Electrophysiology Office Note:   Date:  05/24/2024  ID:  Tericka Devincenzi, DOB 08-Dec-1965, MRN [id number]  Primary Cardiologist: None Primary Heart Failure: None Electrophysiologist: Kalik Hoare Gladis Norton, MD      History of Present Illness:   Jaclyn Murray is a 58 y.o. female with h/o PVCs seen today for routine electrophysiology followup.   She has plans for upcoming left groin cyst excision.   Since last being seen in our clinic the patient reports doing well from a cardiac perspective.  She has no chest pain and no palpitations.  She is able to do her daily activities without restriction.  She does have some mild shortness of breath but she attributes this to a significant amount of stress, with her husband having long COVID and the recent death of a close friend and her father-in-law.   she denies chest pain, palpitations, dyspnea, PND, orthopnea, nausea, vomiting, dizziness, syncope, edema, weight gain, or early satiety.   Review of systems complete and found to be negative unless listed in HPI.   EP Information / Studies Reviewed:    EKG is ordered today. Personal review as below.  EKG Interpretation Date/Time:  Friday May 24 2024 09:26:33 EDT Ventricular Rate:  60 PR Interval:  162 QRS Duration:  84 QT Interval:  420 QTC Calculation: 420 R Axis:   -21  Text Interpretation: Normal sinus rhythm Possible Anterior infarct (cited on or before 20-May-2021) When compared with ECG of 03-Mar-2023 13:33, No significant change was found Confirmed by Hoover Grewe ([id number]) on 05/24/2024 9:40:57 AM     Risk Assessment/Calculations:            Physical Exam:   VS:  BP (!) 156/93 (BP Location: Left Arm, Patient Position: Sitting, Cuff Size: Large)   Pulse 60   Ht 5' 4 (1.626 m)   Wt 192 lb (87.1 kg)   LMP 08/08/2014 (Approximate)   SpO2 96%   BMI 32.96 kg/m    Wt Readings from Last 3 Encounters:  05/24/24 192 lb (87.1 kg)  03/03/23 188 lb 12.8 oz (85.6 kg)   12/26/22 193 lb (87.5 kg)     GEN: Well nourished, well developed in no acute distress NECK: No JVD; No carotid bruits CARDIAC: Regular rate and rhythm, no murmurs, rubs, gallops RESPIRATORY:  Clear to auscultation without rales, wheezing or rhonchi  ABDOMEN: Soft, non-tender, non-distended EXTREMITIES:  No edema; No deformity   ASSESSMENT AND PLAN:    1.  PVCs: Failed flecainide  and mexiletine.  EP 1% burden.  Repeat monitor with a 9% burden.  On propafenone .  She continues to feel well.  She has minimal palpitations.  Ladon Heney continue with current management.  2.  Hypertension: Elevated today.  It is usually well-controlled.  She is under quite a bit of stress and did not sleep well last night.  Zacari Stiff continue with current management.  3.  Preoperative evaluation: Plan for groin cyst excision.  She would be at low to intermediate risk for this low to intermediate risk procedure.  No further cardiac testing is necessary at this time.  Follow up with EP Team in 12 months  Signed, Mayes Sangiovanni Gladis Norton, MD

## 2024-07-10 ENCOUNTER — Ambulatory Visit: Payer: Self-pay | Admitting: General Surgery

## 2024-07-10 ENCOUNTER — Other Ambulatory Visit: Payer: Self-pay

## 2024-07-10 ENCOUNTER — Encounter (HOSPITAL_COMMUNITY): Payer: Self-pay | Admitting: General Surgery

## 2024-07-10 NOTE — H&P (Signed)
 HPI  Jaclyn Murray is an 58 y.o. female who was seen in clinic on 05/07/24 for left groin cyst.  Patient has had cyst since 2018. It occasionally flares and gets inflamed, infected and causes pain. She has undergone a variety of treatments including excision by dermatology when first occurred, steroid injections, antibiotics. She states after first excision the area never fully felt like it resolved. The area is unfortunately in a place where there is high friction, including from clothing and as thighs rub during walking/exercise.   After initial excision, pathology was consistent with fibrosis and epidermal tissue consistent with chronically inflamed epidermal inclusion cyst.    Her most recent flare started in April and just resolved last week.   She was seen in clinic by Dr. Polly on 11/21 due to concern for inflammation. No obvious abscess or fluctuance at that time but was given course of Bactrim.  10 point review of systems is negative except as listed above in HPI.  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  , unable to tolerate oral thyroid  medication, followed by pcp   Headache    hormonal - no current problems   Hypertension    PMB (postmenopausal bleeding)    hx   Submucous uterine fibroid    Wears glasses     Past Surgical History: Past Surgical History:  Procedure Laterality Date   COLONOSCOPY WITH PROPOFOL   05/2016   CYSTOSCOPY N/A 02/07/2017   Procedure: CYSTOSCOPY;  Surgeon: Jaclyn Jaclyn Nikki Bobie FORBES, MD;  Location: [facility];  Service: Gynecology;  Laterality: N/A;   dental implant     with anesthesia lower right back #31   DILATATION & CURETTAGE/HYSTEROSCOPY WITH MYOSURE N/A 02/07/2017    Procedure: DILATATION & CURETTAGE/HYSTEROSCOPY WITH   RESECTOSCOPE OF SUBMUCOSAL FIBROID, SUTURING OF CERVICAL LACERATION;  Surgeon: Jaclyn Jaclyn Nikki Bobie FORBES, MD;  Location: [facility];  Service: Gynecology;  Laterality: N/A;   PVC ABLATION N/A 11/23/2022   Procedure: PVC ABLATION;  Surgeon: Jaclyn Soyla Lunger, MD;  Location: [facility];  Service: Cardiovascular;  Laterality: N/A;   TONSILLECTOMY AND ADENOIDECTOMY  age 89   TOOTH EXTRACTION     with anesthesia    Family History:  Family History  Problem Relation Age of Onset   Dementia Mother    Hypertension Mother    Parkinson's disease Father    Diabetes Sister    Hypertension Sister    Thyroid  disease Sister    Diabetes Sister    Hypertension Sister    Cancer Maternal Grandmother 89       colon ca   Stroke Maternal Grandmother    Thyroid  disease Maternal Grandmother    Cancer Paternal Grandfather 3       colon ca   Hypertension Maternal Grandfather    Breast cancer Neg Hx     Social History:  reports that she has never smoked. She has never used smokeless tobacco. She reports current alcohol use of about 1.0 standard drink of alcohol per week. She reports that she does not use drugs.  Allergies:  Allergies  Allergen Reactions   Ace Inhibitors  Other (See Comments)    develops cough   Ciprofloxacin Other (See Comments)    muscle aches   Erythromycin Rash    Medications: I have reviewed the patient's current medications.  Labs: Pertinent lab work personally reviewed.  Imaging: Pertinent imaging personally reviewed  Physical Exam Last menstrual period 08/08/2014. General: No acute distress, well appearing HEENT: PERRL, hearing grossly normal, mucous membranes moist CV: Regular rate and rhythm Pulm: Normal work of breathing on room air Abd: Soft, nontender, nondistended GU: Left sided groin cyst, nontender to palpation, measuring 0.5x1cm approximately, quite inferior in groin crease.  No erythema or drainage. Extremities: Warm and well perfused Neuro: A&O x4, no focal neurologic deficits Psych: Appropriate mood and effect      Assessment   Jaclyn Murray is an 58 y.o. female with left groin cyst  Plan  - Proceed to OR for excision of left groin cyst - Discussed risks of surgery including but not limited to: bleeding, infection, seroma, hematoma, wound dehiscence, recurrence. Patient understands these risks and wishes to proceed with surgery.    Orie Silversmith, MD General Surgery, Surgical Critical Care and Trauma

## 2024-07-10 NOTE — H&P (View-Only) (Signed)
 HPI  Jaclyn Murray is an 58 y.o. female who was seen in clinic on 05/07/24 for left groin cyst.  Patient has had cyst since 2018. It occasionally flares and gets inflamed, infected and causes pain. She has undergone a variety of treatments including excision by dermatology when first occurred, steroid injections, antibiotics. She states after first excision the area never fully felt like it resolved. The area is unfortunately in a place where there is high friction, including from clothing and as thighs rub during walking/exercise.   After initial excision, pathology was consistent with fibrosis and epidermal tissue consistent with chronically inflamed epidermal inclusion cyst.    Her most recent flare started in April and just resolved last week.   She was seen in clinic by Dr. Polly on 11/21 due to concern for inflammation. No obvious abscess or fluctuance at that time but was given course of Bactrim.  10 point review of systems is negative except as listed above in HPI.  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  , unable to tolerate oral thyroid  medication, followed by pcp   Headache    hormonal - no current problems   Hypertension    PMB (postmenopausal bleeding)    hx   Submucous uterine fibroid    Wears glasses     Past Surgical History: Past Surgical History:  Procedure Laterality Date   COLONOSCOPY WITH PROPOFOL   05/2016   CYSTOSCOPY N/A 02/07/2017   Procedure: CYSTOSCOPY;  Surgeon: Cathlyn JAYSON Nikki Bobie FORBES, MD;  Location: [facility];  Service: Gynecology;  Laterality: N/A;   dental implant     with anesthesia lower right back #31   DILATATION & CURETTAGE/HYSTEROSCOPY WITH MYOSURE N/A 02/07/2017    Procedure: DILATATION & CURETTAGE/HYSTEROSCOPY WITH   RESECTOSCOPE OF SUBMUCOSAL FIBROID, SUTURING OF CERVICAL LACERATION;  Surgeon: Cathlyn JAYSON Nikki Bobie FORBES, MD;  Location: [facility];  Service: Gynecology;  Laterality: N/A;   PVC ABLATION N/A 11/23/2022   Procedure: PVC ABLATION;  Surgeon: Inocencio Soyla Lunger, MD;  Location: [facility];  Service: Cardiovascular;  Laterality: N/A;   TONSILLECTOMY AND ADENOIDECTOMY  age 89   TOOTH EXTRACTION     with anesthesia    Family History:  Family History  Problem Relation Age of Onset   Dementia Mother    Hypertension Mother    Parkinson's disease Father    Diabetes Sister    Hypertension Sister    Thyroid  disease Sister    Diabetes Sister    Hypertension Sister    Cancer Maternal Grandmother 89       colon ca   Stroke Maternal Grandmother    Thyroid  disease Maternal Grandmother    Cancer Paternal Grandfather 3       colon ca   Hypertension Maternal Grandfather    Breast cancer Neg Hx     Social History:  reports that she has never smoked. She has never used smokeless tobacco. She reports current alcohol use of about 1.0 standard drink of alcohol per week. She reports that she does not use drugs.  Allergies:  Allergies  Allergen Reactions   Ace Inhibitors  Other (See Comments)    develops cough   Ciprofloxacin Other (See Comments)    muscle aches   Erythromycin Rash    Medications: I have reviewed the patient's current medications.  Labs: Pertinent lab work personally reviewed.  Imaging: Pertinent imaging personally reviewed  Physical Exam Last menstrual period 08/08/2014. General: No acute distress, well appearing HEENT: PERRL, hearing grossly normal, mucous membranes moist CV: Regular rate and rhythm Pulm: Normal work of breathing on room air Abd: Soft, nontender, nondistended GU: Left sided groin cyst, nontender to palpation, measuring 0.5x1cm approximately, quite inferior in groin crease.  No erythema or drainage. Extremities: Warm and well perfused Neuro: A&O x4, no focal neurologic deficits Psych: Appropriate mood and effect      Assessment   Jaclyn Murray is an 58 y.o. female with left groin cyst  Plan  - Proceed to OR for excision of left groin cyst - Discussed risks of surgery including but not limited to: bleeding, infection, seroma, hematoma, wound dehiscence, recurrence. Patient understands these risks and wishes to proceed with surgery.    Orie Silversmith, MD General Surgery, Surgical Critical Care and Trauma

## 2024-07-10 NOTE — Progress Notes (Signed)
 PCP - Dr Suzen Lamer Cardiologist - Dr Soyla Norton (clearance 05/24/24) per pt. EP - Dr Soyla Norton (clearance 05/24/24)  Chest x-ray - n/a EKG - 05/24/24 Stress Test - 06/14/21 PCV ECHO - 06/14/21 Cardiac Cath - n/a  ICD Pacemaker/Loop - n/a  Sleep Study -  n/a  Diabetes - n/a  Aspirin & Blood Thinner Instructions:  n/a  NPO  Anesthesia review: no  STOP now taking any Aspirin (unless otherwise instructed by your surgeon), Aleve, Naproxen, Ibuprofen, Motrin, Advil, Goody's, BC's, all herbal medications, fish oil, and all vitamins.   Coronavirus Screening Do you have any of the following symptoms:  Cough yes/no: No Fever (>100.58F)  yes/no: No Runny nose Occasional r/t allergies Sore throat yes/no: No Difficulty breathing/shortness of breath  yes/no: No  Have you traveled in the last 14 days and where? yes/no: No  Patient verbalized understanding of instructions that were given via phone.

## 2024-07-12 ENCOUNTER — Ambulatory Visit (HOSPITAL_COMMUNITY)
Admission: RE | Admit: 2024-07-12 | Discharge: 2024-07-12 | Disposition: A | Attending: General Surgery | Admitting: General Surgery

## 2024-07-12 ENCOUNTER — Ambulatory Visit (HOSPITAL_COMMUNITY): Admitting: Registered Nurse

## 2024-07-12 ENCOUNTER — Encounter (HOSPITAL_COMMUNITY): Admission: RE | Disposition: A | Payer: Self-pay | Source: Home / Self Care | Attending: General Surgery

## 2024-07-12 HISTORY — DX: Dyspnea, unspecified: R06.00

## 2024-07-12 HISTORY — DX: Gastro-esophageal reflux disease without esophagitis: K21.9

## 2024-07-12 HISTORY — PX: CYST REMOVAL LEG: SHX6280

## 2024-07-12 HISTORY — DX: Headache, unspecified: R51.9

## 2024-07-12 LAB — BASIC METABOLIC PANEL WITH GFR
Anion gap: 6 (ref 5–15)
BUN: 17 mg/dL (ref 6–20)
CO2: 29 mmol/L (ref 22–32)
Calcium: 8.3 mg/dL — ABNORMAL LOW (ref 8.9–10.3)
Chloride: 106 mmol/L (ref 98–111)
Creatinine, Ser: 1.22 mg/dL — ABNORMAL HIGH (ref 0.44–1.00)
GFR, Estimated: 51 mL/min — ABNORMAL LOW (ref >60)
Glucose, Bld: 95 mg/dL (ref 70–99)
Potassium: 3.3 mmol/L — ABNORMAL LOW (ref 3.5–5.1)
Sodium: 141 mmol/L (ref 135–145)

## 2024-07-12 LAB — CBC
HCT: 32 % — ABNORMAL LOW (ref 36.0–46.0)
Hemoglobin: 11.1 g/dL — ABNORMAL LOW (ref 12.0–15.0)
MCH: 30.7 pg (ref 26.0–34.0)
MCHC: 34.7 g/dL (ref 30.0–36.0)
MCV: 88.6 fL (ref 80.0–100.0)
Platelets: 230 K/uL (ref 150–400)
RBC: 3.61 MIL/uL — ABNORMAL LOW (ref 3.87–5.11)
RDW: 12.8 % (ref 11.5–15.5)
WBC: 4.3 K/uL (ref 4.0–10.5)
nRBC: 0 % (ref 0.0–0.2)

## 2024-07-12 SURGERY — EXCISION, CYST, LOWER EXTREMITY
Anesthesia: General | Laterality: Left

## 2024-07-12 MED ORDER — MIDAZOLAM HCL (PF) 2 MG/2ML IJ SOLN
INTRAMUSCULAR | Status: DC | PRN
  Administered 2024-07-12: 2 mg via INTRAVENOUS

## 2024-07-12 MED ORDER — OXYCODONE HCL 5 MG PO TABS: 5.0000 mg | ORAL_TABLET | Freq: Once | ORAL | Status: DC | PRN

## 2024-07-12 MED ORDER — CEFAZOLIN SODIUM-DEXTROSE 2-4 GM/100ML-% IV SOLN
2.0000 g | INTRAVENOUS | Status: AC
  Administered 2024-07-12: 2 g via INTRAVENOUS
  Filled 2024-07-12: qty 100

## 2024-07-12 MED ORDER — OXYCODONE HCL 5 MG/5ML PO SOLN: 5.0000 mg | Freq: Once | ORAL | Status: DC | PRN

## 2024-07-12 MED ORDER — CELECOXIB 200 MG PO CAPS
200.0000 mg | ORAL_CAPSULE | ORAL | Status: AC
  Administered 2024-07-12: 200 mg via ORAL
  Filled 2024-07-12: qty 1

## 2024-07-12 MED ORDER — FENTANYL CITRATE (PF) 250 MCG/5ML IJ SOLN
INTRAMUSCULAR | Status: DC | PRN
  Administered 2024-07-12: 25 ug via INTRAVENOUS
  Administered 2024-07-12: 50 ug via INTRAVENOUS
  Administered 2024-07-12: 25 ug via INTRAVENOUS

## 2024-07-12 MED ORDER — MIDAZOLAM HCL 2 MG/2ML IJ SOLN
INTRAMUSCULAR | Status: AC
  Filled 2024-07-12: qty 2

## 2024-07-12 MED ORDER — CHLORHEXIDINE GLUCONATE CLOTH 2 % EX PADS
6.0000 | MEDICATED_PAD | Freq: Once | CUTANEOUS | Status: AC
  Administered 2024-07-12: 6 via TOPICAL

## 2024-07-12 MED ORDER — CHLORHEXIDINE GLUCONATE 0.12 % MT SOLN
15.0000 mL | Freq: Once | OROMUCOSAL | Status: AC
  Administered 2024-07-12: 15 mL via OROMUCOSAL
  Filled 2024-07-12: qty 15

## 2024-07-12 MED ORDER — BUPIVACAINE-EPINEPHRINE (PF) 0.25% -1:200000 IJ SOLN
INTRAMUSCULAR | Status: AC
  Filled 2024-07-12: qty 30

## 2024-07-12 MED ORDER — GABAPENTIN 300 MG PO CAPS
300.0000 mg | ORAL_CAPSULE | ORAL | Status: AC
  Administered 2024-07-12: 300 mg via ORAL
  Filled 2024-07-12: qty 1

## 2024-07-12 MED ORDER — BUPIVACAINE-EPINEPHRINE (PF) 0.25% -1:200000 IJ SOLN
INTRAMUSCULAR | Status: DC | PRN
  Administered 2024-07-12: 20 mL

## 2024-07-12 MED ORDER — EPHEDRINE SULFATE-NACL 50-0.9 MG/10ML-% IV SOSY
PREFILLED_SYRINGE | INTRAVENOUS | Status: DC | PRN
  Administered 2024-07-12 (×2): 5 mg via INTRAVENOUS

## 2024-07-12 MED ORDER — LACTATED RINGERS IV SOLN: INTRAVENOUS | Status: DC

## 2024-07-12 MED ORDER — ORAL CARE MOUTH RINSE: 15.0000 mL | Freq: Once | OROMUCOSAL | Status: AC

## 2024-07-12 MED ORDER — LIDOCAINE 2% (20 MG/ML) 5 ML SYRINGE
INTRAMUSCULAR | Status: DC | PRN
  Administered 2024-07-12: 100 mg via INTRAVENOUS

## 2024-07-12 MED ORDER — DEXAMETHASONE SOD PHOSPHATE PF 10 MG/ML IJ SOLN
INTRAMUSCULAR | Status: DC | PRN
  Administered 2024-07-12: 10 mg via INTRAVENOUS

## 2024-07-12 MED ORDER — ACETAMINOPHEN 500 MG PO TABS
1000.0000 mg | ORAL_TABLET | ORAL | Status: AC
  Administered 2024-07-12: 1000 mg via ORAL
  Filled 2024-07-12: qty 2

## 2024-07-12 MED ORDER — DROPERIDOL 2.5 MG/ML IJ SOLN: 0.6250 mg | Freq: Once | INTRAMUSCULAR | Status: DC | PRN

## 2024-07-12 MED ORDER — CHLORHEXIDINE GLUCONATE CLOTH 2 % EX PADS: 6.0000 | MEDICATED_PAD | Freq: Once | CUTANEOUS | Status: DC

## 2024-07-12 MED ORDER — ENOXAPARIN SODIUM 40 MG/0.4ML IJ SOSY
40.0000 mg | PREFILLED_SYRINGE | Freq: Once | INTRAMUSCULAR | Status: AC
  Administered 2024-07-12: 40 mg via SUBCUTANEOUS
  Filled 2024-07-12: qty 0.4

## 2024-07-12 MED ORDER — PROPOFOL 10 MG/ML IV BOLUS
INTRAVENOUS | Status: DC | PRN
  Administered 2024-07-12: 150 mg via INTRAVENOUS

## 2024-07-12 MED ORDER — 0.9 % SODIUM CHLORIDE (POUR BTL) OPTIME
TOPICAL | Status: DC | PRN
  Administered 2024-07-12: 1000 mL

## 2024-07-12 MED ORDER — FENTANYL CITRATE (PF) 100 MCG/2ML IJ SOLN
INTRAMUSCULAR | Status: AC
  Filled 2024-07-12: qty 2

## 2024-07-12 MED ORDER — FENTANYL CITRATE (PF) 100 MCG/2ML IJ SOLN: 25.0000 ug | INTRAMUSCULAR | Status: DC | PRN

## 2024-07-12 MED ORDER — ONDANSETRON HCL 4 MG/2ML IJ SOLN
INTRAMUSCULAR | Status: DC | PRN
  Administered 2024-07-12: 4 mg via INTRAVENOUS

## 2024-07-12 SURGICAL SUPPLY — 30 items
CANISTER SUCTION 3000ML PPV (SUCTIONS) ×1 IMPLANT
CHLORAPREP W/TINT 26 (MISCELLANEOUS) ×1 IMPLANT
COVER SURGICAL LIGHT HANDLE (MISCELLANEOUS) ×1 IMPLANT
DERMABOND ADVANCED .7 DNX12 (GAUZE/BANDAGES/DRESSINGS) ×1 IMPLANT
DRAPE LAPAROSCOPIC ABDOMINAL (DRAPES) IMPLANT
DRAPE LAPAROTOMY 100X72 PEDS (DRAPES) IMPLANT
DRSG TEGADERM 4X4.75 (GAUZE/BANDAGES/DRESSINGS) IMPLANT
ELECTRODE REM PT RTRN 9FT ADLT (ELECTROSURGICAL) ×1 IMPLANT
GAUZE 4X4 16PLY ~~LOC~~+RFID DBL (SPONGE) IMPLANT
GAUZE SPONGE 4X4 12PLY STRL (GAUZE/BANDAGES/DRESSINGS) IMPLANT
GLOVE BIO SURGEON STRL SZ 6 (GLOVE) ×1 IMPLANT
GLOVE INDICATOR 6.5 STRL GRN (GLOVE) ×1 IMPLANT
GOWN STRL REUS W/ TWL LRG LVL3 (GOWN DISPOSABLE) ×1 IMPLANT
KIT BASIN OR (CUSTOM PROCEDURE TRAY) ×1 IMPLANT
KIT TURNOVER KIT B (KITS) ×1 IMPLANT
MARKER SKIN DUAL TIP RULER LAB (MISCELLANEOUS) ×1 IMPLANT
NDL 22X1.5 STRL (OR ONLY) (MISCELLANEOUS) ×1 IMPLANT
PACK GENERAL/GYN (CUSTOM PROCEDURE TRAY) ×1 IMPLANT
PAD ARMBOARD POSITIONER FOAM (MISCELLANEOUS) ×2 IMPLANT
PENCIL SMOKE EVACUATOR (MISCELLANEOUS) ×1 IMPLANT
SOLN 0.9% NACL POUR BTL 1000ML (IV SOLUTION) ×1 IMPLANT
SPONGE T-LAP 4X18 ~~LOC~~+RFID (SPONGE) IMPLANT
STRIP CLOSURE SKIN 1/2X4 (GAUZE/BANDAGES/DRESSINGS) IMPLANT
SUT MNCRL AB 4-0 PS2 18 (SUTURE) IMPLANT
SUT MON AB 4-0 PC3 18 (SUTURE) ×1 IMPLANT
SUT SILK 2 0 PERMA HAND 18 BK (SUTURE) IMPLANT
SUT VIC AB 3-0 SH 18 (SUTURE) IMPLANT
SUT VIC AB 3-0 SH 27X BRD (SUTURE) ×1 IMPLANT
SYR CONTROL 10ML LL (SYRINGE) ×1 IMPLANT
TOWEL GREEN STERILE FF (TOWEL DISPOSABLE) ×1 IMPLANT

## 2024-07-12 NOTE — Transfer of Care (Signed)
 Immediate Anesthesia Transfer of Care Note  Patient: Jaclyn Murray  Procedure(s) Performed: EXCISION, CYST, LOWER EXTREMITY (Left)  Patient Location: PACU  Anesthesia Type:General  Level of Consciousness: drowsy and patient cooperative  Airway & Oxygen Therapy: Patient Spontanous Breathing and Patient connected to face mask oxygen  Post-op Assessment: Report given to RN, Post -op Vital signs reviewed and stable, Patient moving all extremities, and Patient moving all extremities X 4  Post vital signs: Reviewed and stable  Last Vitals:  Vitals Value Taken Time  BP 137/78 07/12/24 08:18  Temp    Pulse 73 07/12/24 08:20  Resp 16 07/12/24 08:20  SpO2 99 % 07/12/24 08:20  Vitals shown include unfiled device data.  Last Pain:  Vitals:   07/12/24 0621  TempSrc:   PainSc: 1       Patients Stated Pain Goal: 1 (07/12/24 9378)  Complications: No notable events documented.

## 2024-07-12 NOTE — Anesthesia Preprocedure Evaluation (Addendum)
 Anesthesia Evaluation  Patient identified by MRN, date of birth, ID band Patient awake    Reviewed: Allergy & Precautions, NPO status , Patient's Chart, lab work & pertinent test results  Airway Mallampati: II  TM Distance: >3 FB Neck ROM: Full    Dental no notable dental hx.    Pulmonary neg pulmonary ROS   Pulmonary exam normal        Cardiovascular hypertension, Pt. on medications  Rhythm:Regular Rate:Normal     Neuro/Psych  Headaches   Depression       GI/Hepatic Neg liver ROS,GERD  ,,  Endo/Other    Renal/GU negative Renal ROS  negative genitourinary   Musculoskeletal Epidermal cyst   Abdominal Normal abdominal exam  (+)   Peds  Hematology  (+) Blood dyscrasia, anemia   Anesthesia Other Findings   Reproductive/Obstetrics                              Anesthesia Physical Anesthesia Plan  ASA: 3  Anesthesia Plan: General   Post-op Pain Management: Tylenol  PO (pre-op)*, Celebrex  PO (pre-op)* and Gabapentin  PO (pre-op)*   Induction: Intravenous  PONV Risk Score and Plan: 3 and Ondansetron , Dexamethasone , Midazolam  and Treatment may vary due to age or medical condition  Airway Management Planned: LMA and Mask  Additional Equipment: None  Intra-op Plan:   Post-operative Plan: Extubation in OR  Informed Consent: I have reviewed the patients History and Physical, chart, labs and discussed the procedure including the risks, benefits and alternatives for the proposed anesthesia with the patient or authorized representative who has indicated his/her understanding and acceptance.     Dental advisory given  Plan Discussed with: CRNA  Anesthesia Plan Comments:         Anesthesia Quick Evaluation

## 2024-07-12 NOTE — Anesthesia Postprocedure Evaluation (Signed)
 Anesthesia Post Note  Patient: Jaclyn Murray  Procedure(s) Performed: EXCISION, CYST, LOWER EXTREMITY (Left)     Patient location during evaluation: PACU Anesthesia Type: General Level of consciousness: awake and alert Pain management: pain level controlled Vital Signs Assessment: post-procedure vital signs reviewed and stable Respiratory status: spontaneous breathing, nonlabored ventilation, respiratory function stable and patient connected to nasal cannula oxygen Cardiovascular status: blood pressure returned to baseline and stable Postop Assessment: no apparent nausea or vomiting Anesthetic complications: no   No notable events documented.  Last Vitals:  Vitals:   07/12/24 0930 07/12/24 0945  BP: (!) 108/49 139/78  Pulse: (!) 57 (!) 56  Resp: 11 14  Temp:  36.5 C  SpO2: 98% 97%    Last Pain:  Vitals:   07/12/24 0845  TempSrc:   PainSc: 0-No pain                 Jaclyn Murray

## 2024-07-12 NOTE — Interval H&P Note (Signed)
 History and Physical Interval Note:  07/12/2024 7:09 AM  [name]  has presented today for surgery, with the diagnosis of EPIDERMAL INCLUSION CYST.  The various methods of treatment have been discussed with the patient and family. After consideration of risks, benefits and other options for treatment, the patient has consented to  Procedure(s) with comments: EXCISION, CYST, LOWER EXTREMITY (Left) - EXCISION LEFT GROIN CYST LMA as a surgical intervention.  The patient's history has been reviewed, patient examined, no change in status, stable for surgery.  I have reviewed the patient's chart and labs.  Questions were answered to the patient's satisfaction.     Richerd Silversmith

## 2024-07-12 NOTE — Anesthesia Procedure Notes (Signed)
 Procedure Name: LMA Insertion Date/Time: 07/12/2024 7:30 AM  Performed by: Virgil Ee, CRNAPre-anesthesia Checklist: Patient identified, Patient being monitored, Timeout performed, Emergency Drugs available and Suction available Patient Re-evaluated:Patient Re-evaluated prior to induction Oxygen Delivery Method: Circle system utilized Preoxygenation: Pre-oxygenation with 100% oxygen Induction Type: IV induction Ventilation: Mask ventilation without difficulty LMA: LMA inserted LMA Size: 4.0 Tube type: Oral Number of attempts: 1 Placement Confirmation: positive ETCO2 and breath sounds checked- equal and bilateral Tube secured with: Tape Dental Injury: Teeth and Oropharynx as per pre-operative assessment

## 2024-07-12 NOTE — Op Note (Signed)
 EXCISION, CYST, LOWER EXTREMITY  Operative Note (CSN: [id number])  Service  Date of Surgery: 07/12/2024 Admit Date: 07/12/2024 Performing Service: General Surgeons and Role:    DEWAINE Silversmith, Richerd, MD - Primary  Op Note Pre-op Diagnosis: EPIDERMAL INCLUSION CYST Post-op Diagnosis: Chronically irritated epidermis and dermis, lipoma  Procedure(s): EXCISION, Lower extremity lesion  Findings: Chronically irritated epidermis and dermis, no cyst noted, small 1x0.5cm lipoma excised.  Anesthesia: General Estimated Blood Loss: Minimal  Complications: None Specimens:  ID Type Source Tests Collected by Time Destination  1 : Left Groin Mass Tissue Path Tissue SURGICAL PATHOLOGY Silversmith Richerd, MD 07/12/2024 0750     Brief history / Indications for Surgery: Patient with several year history of presumed cyst that would have cycles of becoming inflamed and painful and then resolving. No obvious mass on exam but patient only has palpable lesion when inflamed.  Procedure Details  Prior to the procedure, the risks, benefits, complications, treatment options, and expected outcomes were discussed with the patient and/or family, including but not limited to, the risks of bleeding, infection, post-op seroma, post-op hematoma, wound dehiscence, and recurrence.SABRA  Despite the risks, the patient has given informed consent for operative intervention.  The patient was taken to the Operating Room, identified as Jaclyn Murray and the procedure verified as excision of left groin lesion.  Identification pause was held and the above information confirmed.  The patient was placed in the supine position and General LMA anesthesia was induced. The bilateral labias and inguinal creases and left upper thigh was prepped with betadine and draped in the typical sterile fashion.  A formal preincision time out was performed.  An elliptical incision was made overlying the affected skin without obvious mass. The skin was  excised from underlying tissue using electrocautery. The subcutaneous tissue underneath the excised skin was explored. No obvious cyst. There was a small area of subcutaneous fat that appeared  well circumscribed within surrounding subcutaneous fat. Felt this may be a very small lipoma that could be contributing to discomfort and this was excised and sent for pathology.   Wound bed irrigated and hemostasis confirmed. Wound bed closed in layers using interrupted 3-0 vicryl to approximate subcutaneous tissue, and then 4-0 vicryl interrupted deep dermals. Skin closed with 4-0 monocryl and dermabond.  Instrument, sponge, and needle counts were correct at the conclusion of the case.   Orie Silversmith, MD [facility] Surgery Date: 07/12/2024  Time: 8:11 AM

## 2024-07-13 ENCOUNTER — Encounter (HOSPITAL_COMMUNITY): Payer: Self-pay | Admitting: General Surgery

## 2024-07-15 LAB — SURGICAL PATHOLOGY
